# Patient Record
Sex: Male | Born: 1935 | Race: White | Hispanic: No
Health system: Southern US, Community
[De-identification: ages and names within clinical notes are randomized; demographics above are authoritative.]

---

## 1999-09-17 ENCOUNTER — Encounter: Payer: Self-pay | Admitting: *Deleted

## 1999-09-17 ENCOUNTER — Ambulatory Visit (HOSPITAL_COMMUNITY): Admission: RE | Admit: 1999-09-17 | Discharge: 1999-09-17 | Payer: Self-pay | Admitting: *Deleted

## 2005-10-17 ENCOUNTER — Ambulatory Visit: Payer: Self-pay | Admitting: Gastroenterology

## 2006-04-15 ENCOUNTER — Inpatient Hospital Stay: Payer: Self-pay | Admitting: General Practice

## 2008-04-01 ENCOUNTER — Ambulatory Visit: Payer: Self-pay | Admitting: Internal Medicine

## 2009-04-14 ENCOUNTER — Ambulatory Visit: Payer: Self-pay | Admitting: Internal Medicine

## 2011-02-12 ENCOUNTER — Ambulatory Visit: Payer: Self-pay | Admitting: Internal Medicine

## 2011-03-10 ENCOUNTER — Ambulatory Visit: Payer: Self-pay | Admitting: Internal Medicine

## 2011-10-17 ENCOUNTER — Ambulatory Visit: Payer: Self-pay | Admitting: Internal Medicine

## 2011-11-04 ENCOUNTER — Ambulatory Visit: Payer: Self-pay | Admitting: Internal Medicine

## 2012-02-08 ENCOUNTER — Ambulatory Visit: Payer: Self-pay | Admitting: Internal Medicine

## 2012-02-10 ENCOUNTER — Inpatient Hospital Stay: Payer: Self-pay | Admitting: Orthopedic Surgery

## 2012-02-10 LAB — URINALYSIS, COMPLETE
Bacteria: NONE SEEN
Bilirubin,UR: NEGATIVE
Leukocyte Esterase: NEGATIVE
Ph: 6 (ref 4.5–8.0)
Protein: NEGATIVE
RBC,UR: 1 /HPF (ref 0–5)
Specific Gravity: 1.015 (ref 1.003–1.030)
WBC UR: <1 /HPF (ref 0–5)

## 2012-02-10 LAB — CBC WITH DIFFERENTIAL/PLATELET
Basophil #: 0 10*3/uL (ref 0.0–0.1)
Basophil %: 0.6 %
Eosinophil #: 0.1 10*3/uL (ref 0.0–0.7)
Eosinophil %: 1.5 %
HCT: 35.8 % — ABNORMAL LOW (ref 40.0–52.0)
HGB: 11.7 g/dL — ABNORMAL LOW (ref 13.0–18.0)
Lymphocyte #: 0.7 10*3/uL — ABNORMAL LOW (ref 1.0–3.6)
Lymphocyte %: 10.5 %
MCH: 34.4 pg — ABNORMAL HIGH (ref 26.0–34.0)
MCHC: 32.8 g/dL (ref 32.0–36.0)
MCV: 105 fL — ABNORMAL HIGH (ref 80–100)
Monocyte #: 0.3 x10 3/mm (ref 0.2–1.0)
Monocyte %: 4.7 %
Neutrophil #: 5.8 10*3/uL (ref 1.4–6.5)
Neutrophil %: 82.7 %
Platelet: 150 10*3/uL (ref 150–440)
RBC: 3.42 10*6/uL — ABNORMAL LOW (ref 4.40–5.90)
RDW: 15 % — ABNORMAL HIGH (ref 11.5–14.5)
WBC: 7 10*3/uL (ref 3.8–10.6)

## 2012-02-10 LAB — BASIC METABOLIC PANEL
Anion Gap: 9 (ref 7–16)
BUN: 22 mg/dL — ABNORMAL HIGH (ref 7–18)
Calcium, Total: 8.9 mg/dL (ref 8.5–10.1)
Chloride: 108 mmol/L — ABNORMAL HIGH (ref 98–107)
Co2: 24 mmol/L (ref 21–32)
Creatinine: 1.04 mg/dL (ref 0.60–1.30)
EGFR (African American): >60
EGFR (Non-African Amer.): >60
Glucose: 138 mg/dL — ABNORMAL HIGH (ref 65–99)
Osmolality: 287 (ref 275–301)
Potassium: 4.2 mmol/L (ref 3.5–5.1)
Sodium: 141 mmol/L (ref 136–145)

## 2012-02-10 LAB — PROTIME-INR
INR: 1.1
Prothrombin Time: 14.7 secs (ref 11.5–14.7)

## 2012-02-10 LAB — APTT: Activated PTT: 43 secs — ABNORMAL HIGH (ref 23.6–35.9)

## 2012-02-11 LAB — CBC WITH DIFFERENTIAL/PLATELET
Basophil #: 0 10*3/uL (ref 0.0–0.1)
Basophil %: 0.3 %
Eosinophil #: 0.2 10*3/uL (ref 0.0–0.7)
Eosinophil %: 2.2 %
HCT: 30.7 % — ABNORMAL LOW (ref 40.0–52.0)
Lymphocyte %: 6.8 %
MCH: 35 pg — ABNORMAL HIGH (ref 26.0–34.0)
MCHC: 33.6 g/dL (ref 32.0–36.0)
MCV: 104 fL — ABNORMAL HIGH (ref 80–100)
Monocyte #: 0.3 x10 3/mm (ref 0.2–1.0)
Monocyte %: 3.5 %
Neutrophil #: 8.2 10*3/uL — ABNORMAL HIGH (ref 1.4–6.5)
Neutrophil %: 87.2 %
Platelet: 122 10*3/uL — ABNORMAL LOW (ref 150–440)
RBC: 2.94 10*6/uL — ABNORMAL LOW (ref 4.40–5.90)

## 2012-02-11 LAB — PROTIME-INR
INR: 1.4
Prothrombin Time: 17.7 secs — ABNORMAL HIGH (ref 11.5–14.7)

## 2012-02-11 LAB — HEPATIC FUNCTION PANEL A (ARMC)
Albumin: 3 g/dL — ABNORMAL LOW (ref 3.4–5.0)
Alkaline Phosphatase: 94 U/L (ref 50–136)
Bilirubin, Direct: 0.3 mg/dL — ABNORMAL HIGH (ref 0.00–0.20)
Bilirubin,Total: 1.3 mg/dL — ABNORMAL HIGH (ref 0.2–1.0)
SGOT(AST): 13 U/L — ABNORMAL LOW (ref 15–37)
SGPT (ALT): 18 U/L
Total Protein: 6.2 g/dL — ABNORMAL LOW (ref 6.4–8.2)

## 2012-02-11 LAB — BASIC METABOLIC PANEL
Calcium, Total: 8 mg/dL — ABNORMAL LOW (ref 8.5–10.1)
Chloride: 108 mmol/L — ABNORMAL HIGH (ref 98–107)
Co2: 25 mmol/L (ref 21–32)
Creatinine: 1.17 mg/dL (ref 0.60–1.30)
EGFR (African American): >60
EGFR (Non-African Amer.): 53 — ABNORMAL LOW
Glucose: 176 mg/dL — ABNORMAL HIGH (ref 65–99)
Potassium: 4.1 mmol/L (ref 3.5–5.1)
Sodium: 139 mmol/L (ref 136–145)

## 2012-02-12 LAB — BASIC METABOLIC PANEL
Anion Gap: 6 — ABNORMAL LOW (ref 7–16)
BUN: 19 mg/dL — ABNORMAL HIGH (ref 7–18)
Calcium, Total: 8.3 mg/dL — ABNORMAL LOW (ref 8.5–10.1)
Chloride: 108 mmol/L — ABNORMAL HIGH (ref 98–107)
Co2: 27 mmol/L (ref 21–32)
Creatinine: 1.19 mg/dL (ref 0.60–1.30)
Glucose: 149 mg/dL — ABNORMAL HIGH (ref 65–99)
Sodium: 141 mmol/L (ref 136–145)

## 2012-02-12 LAB — CBC WITH DIFFERENTIAL/PLATELET
Basophil #: 0 10*3/uL (ref 0.0–0.1)
Eosinophil #: 0.3 10*3/uL (ref 0.0–0.7)
HCT: 28.5 % — ABNORMAL LOW (ref 40.0–52.0)
HGB: 9.6 g/dL — ABNORMAL LOW (ref 13.0–18.0)
MCHC: 33.7 g/dL (ref 32.0–36.0)
MCV: 104 fL — ABNORMAL HIGH (ref 80–100)
Monocyte #: 0.3 x10 3/mm (ref 0.2–1.0)
Monocyte %: 3.4 %
RBC: 2.75 10*6/uL — ABNORMAL LOW (ref 4.40–5.90)
RDW: 15.1 % — ABNORMAL HIGH (ref 11.5–14.5)
WBC: 7.4 10*3/uL (ref 3.8–10.6)

## 2012-02-12 LAB — PROTIME-INR
INR: 1.2
Prothrombin Time: 15.9 secs — ABNORMAL HIGH (ref 11.5–14.7)

## 2012-02-12 LAB — APTT: Activated PTT: 47.5 secs — ABNORMAL HIGH (ref 23.6–35.9)

## 2012-02-13 LAB — CBC WITH DIFFERENTIAL/PLATELET
Basophil #: 0 10*3/uL (ref 0.0–0.1)
Basophil %: 0.5 %
Eosinophil %: 0.7 %
HCT: 32.7 % — ABNORMAL LOW (ref 40.0–52.0)
Lymphocyte #: 0.4 10*3/uL — ABNORMAL LOW (ref 1.0–3.6)
Lymphocyte %: 4.4 %
MCH: 33.5 pg (ref 26.0–34.0)
MCV: 101 fL — ABNORMAL HIGH (ref 80–100)
Platelet: 120 10*3/uL — ABNORMAL LOW (ref 150–440)
RBC: 3.23 10*6/uL — ABNORMAL LOW (ref 4.40–5.90)
RDW: 18.3 % — ABNORMAL HIGH (ref 11.5–14.5)
WBC: 8.7 10*3/uL (ref 3.8–10.6)

## 2012-02-13 LAB — BASIC METABOLIC PANEL
Anion Gap: 10 (ref 7–16)
Chloride: 105 mmol/L (ref 98–107)
Co2: 23 mmol/L (ref 21–32)
Creatinine: 1.13 mg/dL (ref 0.60–1.30)
EGFR (African American): >60
EGFR (Non-African Amer.): 56 — ABNORMAL LOW
Sodium: 138 mmol/L (ref 136–145)

## 2012-02-13 LAB — PROTIME-INR
INR: 1.2
Prothrombin Time: 16 secs — ABNORMAL HIGH (ref 11.5–14.7)
Prothrombin Time: 17 secs — ABNORMAL HIGH (ref 11.5–14.7)

## 2012-02-13 LAB — APTT: Activated PTT: 41.1 secs — ABNORMAL HIGH (ref 23.6–35.9)

## 2012-02-14 LAB — PROTIME-INR
INR: 1.4
Prothrombin Time: 17.2 secs — ABNORMAL HIGH (ref 11.5–14.7)

## 2012-02-14 LAB — APTT: Activated PTT: 43.7 secs — ABNORMAL HIGH (ref 23.6–35.9)

## 2012-02-14 LAB — CBC WITH DIFFERENTIAL/PLATELET
Basophil #: 0 10*3/uL (ref 0.0–0.1)
Basophil %: 0 %
Eosinophil #: 0 10*3/uL (ref 0.0–0.7)
HCT: 26.5 % — ABNORMAL LOW (ref 40.0–52.0)
MCH: 34.1 pg — ABNORMAL HIGH (ref 26.0–34.0)
Monocyte #: 0.4 x10 3/mm (ref 0.2–1.0)
Neutrophil %: 90.5 %
Platelet: 114 10*3/uL — ABNORMAL LOW (ref 150–440)
RBC: 2.65 10*6/uL — ABNORMAL LOW (ref 4.40–5.90)
RDW: 18.3 % — ABNORMAL HIGH (ref 11.5–14.5)

## 2012-02-14 LAB — BASIC METABOLIC PANEL
Anion Gap: 7 (ref 7–16)
BUN: 26 mg/dL — ABNORMAL HIGH (ref 7–18)
Chloride: 106 mmol/L (ref 98–107)
Co2: 26 mmol/L (ref 21–32)
Osmolality: 289 (ref 275–301)
Sodium: 139 mmol/L (ref 136–145)

## 2012-02-15 LAB — CBC WITH DIFFERENTIAL/PLATELET
Eosinophil #: 0.3 10*3/uL (ref 0.0–0.7)
HCT: 24.4 % — ABNORMAL LOW (ref 40.0–52.0)
Lymphocyte %: 10.2 %
MCV: 101 fL — ABNORMAL HIGH (ref 80–100)
Monocyte #: 0.3 x10 3/mm (ref 0.2–1.0)
Monocyte %: 4 %
Neutrophil #: 6.1 10*3/uL (ref 1.4–6.5)
Neutrophil %: 81.2 %
RBC: 2.42 10*6/uL — ABNORMAL LOW (ref 4.40–5.90)
RDW: 17.6 % — ABNORMAL HIGH (ref 11.5–14.5)
WBC: 7.5 10*3/uL (ref 3.8–10.6)

## 2012-02-15 LAB — APTT: Activated PTT: 46.1 secs — ABNORMAL HIGH (ref 23.6–35.9)

## 2012-02-15 LAB — PROTIME-INR: Prothrombin Time: 16 secs — ABNORMAL HIGH (ref 11.5–14.7)

## 2012-02-16 LAB — CBC WITH DIFFERENTIAL/PLATELET
Comment - H1-Com3: NORMAL
Eosinophil: 10 %
HCT: 24.3 % — ABNORMAL LOW (ref 40.0–52.0)
HGB: 8.1 g/dL — ABNORMAL LOW (ref 13.0–18.0)
MCV: 100 fL (ref 80–100)
Platelet: 158 10*3/uL (ref 150–440)
Segmented Neutrophils: 72 %
WBC: 7 10*3/uL (ref 3.8–10.6)

## 2012-02-16 LAB — PROTIME-INR: INR: 1.2

## 2012-02-17 LAB — BASIC METABOLIC PANEL
Anion Gap: 10 (ref 7–16)
Calcium, Total: 8.6 mg/dL (ref 8.5–10.1)
Chloride: 103 mmol/L (ref 98–107)
Co2: 27 mmol/L (ref 21–32)
EGFR (African American): >60
EGFR (Non-African Amer.): 52 — ABNORMAL LOW
Glucose: 133 mg/dL — ABNORMAL HIGH (ref 65–99)
Osmolality: 285 (ref 275–301)
Potassium: 3.9 mmol/L (ref 3.5–5.1)

## 2012-02-17 LAB — HEMOGLOBIN: HGB: 8.6 g/dL — ABNORMAL LOW (ref 13.0–18.0)

## 2012-02-17 LAB — PATHOLOGY REPORT

## 2012-02-19 LAB — CBC WITH DIFFERENTIAL/PLATELET
Basophil %: 0.5 %
Eosinophil #: 0.2 10*3/uL (ref 0.0–0.7)
Eosinophil %: 2.3 %
HCT: 24.8 % — ABNORMAL LOW (ref 40.0–52.0)
HGB: 8.1 g/dL — ABNORMAL LOW (ref 13.0–18.0)
Lymphocyte #: 0.9 10*3/uL — ABNORMAL LOW (ref 1.0–3.6)
Lymphocyte %: 7.8 %
MCH: 33.3 pg (ref 26.0–34.0)
MCV: 102 fL — ABNORMAL HIGH (ref 80–100)
Monocyte %: 4.6 %
Platelet: 310 10*3/uL (ref 150–440)
WBC: 10.9 10*3/uL — ABNORMAL HIGH (ref 3.8–10.6)

## 2012-02-19 LAB — PROTIME-INR
INR: 1.2
Prothrombin Time: 15.5 secs — ABNORMAL HIGH (ref 11.5–14.7)

## 2012-02-19 LAB — BASIC METABOLIC PANEL
Anion Gap: 9 (ref 7–16)
Calcium, Total: 8.4 mg/dL — ABNORMAL LOW (ref 8.5–10.1)
Chloride: 105 mmol/L (ref 98–107)
Co2: 28 mmol/L (ref 21–32)
EGFR (African American): >60
Potassium: 4.4 mmol/L (ref 3.5–5.1)
Sodium: 142 mmol/L (ref 136–145)

## 2012-02-19 LAB — APTT: Activated PTT: 48 secs — ABNORMAL HIGH (ref 23.6–35.9)

## 2012-02-20 LAB — CBC WITH DIFFERENTIAL/PLATELET
Basophil %: 0.4 %
Eosinophil #: 0.2 10*3/uL (ref 0.0–0.7)
HCT: 24 % — ABNORMAL LOW (ref 40.0–52.0)
MCH: 33.6 pg (ref 26.0–34.0)
MCHC: 33 g/dL (ref 32.0–36.0)
MCV: 102 fL — ABNORMAL HIGH (ref 80–100)
Monocyte #: 0.6 x10 3/mm (ref 0.2–1.0)
Monocyte %: 5.5 %
Neutrophil #: 9 10*3/uL — ABNORMAL HIGH (ref 1.4–6.5)
Platelet: 333 10*3/uL (ref 150–440)
RDW: 16.9 % — ABNORMAL HIGH (ref 11.5–14.5)
WBC: 10.6 10*3/uL (ref 3.8–10.6)

## 2012-02-21 LAB — CBC WITH DIFFERENTIAL/PLATELET
Basophil #: 0 10*3/uL (ref 0.0–0.1)
Eosinophil #: 0.1 10*3/uL (ref 0.0–0.7)
Lymphocyte %: 4.5 %
MCH: 32.8 pg (ref 26.0–34.0)
MCHC: 31.7 g/dL — ABNORMAL LOW (ref 32.0–36.0)
MCV: 104 fL — ABNORMAL HIGH (ref 80–100)
Monocyte #: 1 x10 3/mm (ref 0.2–1.0)
Monocyte %: 6.9 %
WBC: 14.4 10*3/uL — ABNORMAL HIGH (ref 3.8–10.6)

## 2012-02-23 ENCOUNTER — Encounter: Payer: Self-pay | Admitting: Internal Medicine

## 2012-02-27 ENCOUNTER — Emergency Department: Payer: Self-pay | Admitting: Emergency Medicine

## 2012-03-09 ENCOUNTER — Encounter: Payer: Self-pay | Admitting: Internal Medicine

## 2012-03-09 ENCOUNTER — Ambulatory Visit: Payer: Self-pay | Admitting: Oncology

## 2012-03-25 ENCOUNTER — Emergency Department: Payer: Self-pay | Admitting: Emergency Medicine

## 2012-03-26 ENCOUNTER — Inpatient Hospital Stay: Payer: Self-pay | Admitting: Internal Medicine

## 2012-03-26 LAB — URINALYSIS, COMPLETE
Bilirubin,UR: NEGATIVE
Blood: NEGATIVE
Glucose,UR: NEGATIVE mg/dL (ref 0–75)
Ketone: NEGATIVE
Leukocyte Esterase: NEGATIVE
Nitrite: NEGATIVE
Ph: 5 (ref 4.5–8.0)
Protein: NEGATIVE
RBC,UR: 2 /HPF (ref 0–5)
Specific Gravity: 1.021 (ref 1.003–1.030)
Squamous Epithelial: <1
WBC UR: 1 /HPF (ref 0–5)

## 2012-03-26 LAB — CBC
HCT: 29.1 % — ABNORMAL LOW (ref 40.0–52.0)
HGB: 9.6 g/dL — ABNORMAL LOW (ref 13.0–18.0)
MCH: 34 pg (ref 26.0–34.0)
MCHC: 33 g/dL (ref 32.0–36.0)
MCV: 103 fL — ABNORMAL HIGH (ref 80–100)
Platelet: 121 10*3/uL — ABNORMAL LOW (ref 150–440)
RBC: 2.82 10*6/uL — ABNORMAL LOW (ref 4.40–5.90)
RDW: 17.3 % — ABNORMAL HIGH (ref 11.5–14.5)
WBC: 9.4 10*3/uL (ref 3.8–10.6)

## 2012-03-26 LAB — COMPREHENSIVE METABOLIC PANEL
Albumin: 2.5 g/dL — ABNORMAL LOW (ref 3.4–5.0)
Alkaline Phosphatase: 192 U/L — ABNORMAL HIGH (ref 50–136)
Anion Gap: 11 (ref 7–16)
BUN: 32 mg/dL — ABNORMAL HIGH (ref 7–18)
Bilirubin,Total: 1.2 mg/dL — ABNORMAL HIGH (ref 0.2–1.0)
Calcium, Total: 8.1 mg/dL — ABNORMAL LOW (ref 8.5–10.1)
Chloride: 102 mmol/L (ref 98–107)
Co2: 22 mmol/L (ref 21–32)
Creatinine: 1.69 mg/dL — ABNORMAL HIGH (ref 0.60–1.30)
EGFR (African American): 40 — ABNORMAL LOW
EGFR (Non-African Amer.): 34 — ABNORMAL LOW
Glucose: 171 mg/dL — ABNORMAL HIGH (ref 65–99)
Osmolality: 281 (ref 275–301)
Potassium: 3.9 mmol/L (ref 3.5–5.1)
SGOT(AST): 31 U/L (ref 15–37)
SGPT (ALT): 105 U/L — ABNORMAL HIGH
Sodium: 135 mmol/L — ABNORMAL LOW (ref 136–145)
Total Protein: 5.7 g/dL — ABNORMAL LOW (ref 6.4–8.2)

## 2012-03-26 LAB — CK TOTAL AND CKMB (NOT AT ARMC)
CK, Total: 141 U/L (ref 35–232)
CK-MB: 1.1 ng/mL (ref 0.5–3.6)

## 2012-03-26 LAB — DIGOXIN LEVEL: Digoxin: 1.08 ng/mL

## 2012-03-26 LAB — TROPONIN I
Troponin-I: 0.17 ng/mL — ABNORMAL HIGH
Troponin-I: 0.19 ng/mL — ABNORMAL HIGH

## 2012-03-26 LAB — PRO B NATRIURETIC PEPTIDE: B-Type Natriuretic Peptide: 16080 pg/mL — ABNORMAL HIGH (ref 0–450)

## 2012-03-27 LAB — CBC WITH DIFFERENTIAL/PLATELET
Basophil %: 0.2 %
Eosinophil #: 0.1 10*3/uL (ref 0.0–0.7)
Eosinophil %: 1 %
HCT: 27.2 % — ABNORMAL LOW (ref 40.0–52.0)
HGB: 8.7 g/dL — ABNORMAL LOW (ref 13.0–18.0)
Lymphocyte #: 0.4 10*3/uL — ABNORMAL LOW (ref 1.0–3.6)
MCH: 33.3 pg (ref 26.0–34.0)
MCHC: 32.1 g/dL (ref 32.0–36.0)
MCV: 104 fL — ABNORMAL HIGH (ref 80–100)
Monocyte #: 0.3 x10 3/mm (ref 0.2–1.0)
Monocyte %: 3.1 %
Neutrophil #: 7.2 10*3/uL — ABNORMAL HIGH (ref 1.4–6.5)
RBC: 2.62 10*6/uL — ABNORMAL LOW (ref 4.40–5.90)

## 2012-03-27 LAB — COMPREHENSIVE METABOLIC PANEL
Albumin: 2.1 g/dL — ABNORMAL LOW (ref 3.4–5.0)
Alkaline Phosphatase: 168 U/L — ABNORMAL HIGH (ref 50–136)
Anion Gap: 9 (ref 7–16)
BUN: 34 mg/dL — ABNORMAL HIGH (ref 7–18)
Calcium, Total: 7.5 mg/dL — ABNORMAL LOW (ref 8.5–10.1)
Co2: 22 mmol/L (ref 21–32)
Glucose: 181 mg/dL — ABNORMAL HIGH (ref 65–99)
Potassium: 4.2 mmol/L (ref 3.5–5.1)
SGOT(AST): 27 U/L (ref 15–37)
SGPT (ALT): 82 U/L — ABNORMAL HIGH
Sodium: 136 mmol/L (ref 136–145)

## 2012-03-27 LAB — BASIC METABOLIC PANEL
Anion Gap: 11 (ref 7–16)
BUN: 38 mg/dL — ABNORMAL HIGH (ref 7–18)
Co2: 20 mmol/L — ABNORMAL LOW (ref 21–32)
Creatinine: 2.02 mg/dL — ABNORMAL HIGH (ref 0.60–1.30)
EGFR (African American): 32 — ABNORMAL LOW
EGFR (Non-African Amer.): 28 — ABNORMAL LOW
Sodium: 136 mmol/L (ref 136–145)

## 2012-03-27 LAB — MAGNESIUM: Magnesium: 1.5 mg/dL — ABNORMAL LOW

## 2012-03-28 LAB — COMPREHENSIVE METABOLIC PANEL
Albumin: 2 g/dL — ABNORMAL LOW (ref 3.4–5.0)
Anion Gap: 12 (ref 7–16)
BUN: 35 mg/dL — ABNORMAL HIGH (ref 7–18)
Bilirubin,Total: 0.7 mg/dL (ref 0.2–1.0)
Chloride: 104 mmol/L (ref 98–107)
Co2: 18 mmol/L — ABNORMAL LOW (ref 21–32)
EGFR (African American): 43 — ABNORMAL LOW
SGOT(AST): 14 U/L — ABNORMAL LOW (ref 15–37)
SGPT (ALT): 55 U/L

## 2012-03-28 LAB — CBC WITH DIFFERENTIAL/PLATELET
Basophil %: 0.2 %
HCT: 26 % — ABNORMAL LOW (ref 40.0–52.0)
HGB: 8.8 g/dL — ABNORMAL LOW (ref 13.0–18.0)
Lymphocyte #: 0.4 10*3/uL — ABNORMAL LOW (ref 1.0–3.6)
Lymphocyte %: 4.7 %
MCH: 34.3 pg — ABNORMAL HIGH (ref 26.0–34.0)
MCV: 102 fL — ABNORMAL HIGH (ref 80–100)
Monocyte %: 2.5 %
Neutrophil #: 6.7 10*3/uL — ABNORMAL HIGH (ref 1.4–6.5)
Platelet: 123 10*3/uL — ABNORMAL LOW (ref 150–440)
RDW: 17.6 % — ABNORMAL HIGH (ref 11.5–14.5)
WBC: 7.4 10*3/uL (ref 3.8–10.6)

## 2012-03-29 LAB — CBC WITH DIFFERENTIAL/PLATELET
Basophil %: 0.5 %
Eosinophil #: 0.3 10*3/uL (ref 0.0–0.7)
HCT: 27.5 % — ABNORMAL LOW (ref 40.0–52.0)
HGB: 8.8 g/dL — ABNORMAL LOW (ref 13.0–18.0)
Lymphocyte #: 0.5 10*3/uL — ABNORMAL LOW (ref 1.0–3.6)
MCH: 33.3 pg (ref 26.0–34.0)
MCHC: 32.2 g/dL (ref 32.0–36.0)
MCV: 103 fL — ABNORMAL HIGH (ref 80–100)
Monocyte #: 0.2 x10 3/mm (ref 0.2–1.0)
Monocyte %: 3.3 %
Neutrophil #: 5.9 10*3/uL (ref 1.4–6.5)
Neutrophil %: 84.1 %
Platelet: 133 10*3/uL — ABNORMAL LOW (ref 150–440)
WBC: 7 10*3/uL (ref 3.8–10.6)

## 2012-03-29 LAB — BASIC METABOLIC PANEL
Anion Gap: 10 (ref 7–16)
BUN: 36 mg/dL — ABNORMAL HIGH (ref 7–18)
Calcium, Total: 7.9 mg/dL — ABNORMAL LOW (ref 8.5–10.1)
Chloride: 106 mmol/L (ref 98–107)
Co2: 20 mmol/L — ABNORMAL LOW (ref 21–32)
EGFR (African American): 40 — ABNORMAL LOW
Glucose: 128 mg/dL — ABNORMAL HIGH (ref 65–99)
Osmolality: 282 (ref 275–301)
Potassium: 4 mmol/L (ref 3.5–5.1)

## 2012-03-29 LAB — CULTURE, BLOOD (SINGLE)

## 2012-03-30 LAB — BASIC METABOLIC PANEL
Calcium, Total: 8 mg/dL — ABNORMAL LOW (ref 8.5–10.1)
Creatinine: 1.48 mg/dL — ABNORMAL HIGH (ref 0.60–1.30)
EGFR (African American): 47 — ABNORMAL LOW
EGFR (Non-African Amer.): 40 — ABNORMAL LOW
Glucose: 107 mg/dL — ABNORMAL HIGH (ref 65–99)
Osmolality: 282 (ref 275–301)
Potassium: 4.3 mmol/L (ref 3.5–5.1)

## 2012-03-30 LAB — CBC WITH DIFFERENTIAL/PLATELET
Basophil #: 0 10*3/uL (ref 0.0–0.1)
Basophil %: 0.3 %
Eosinophil %: 4.9 %
HGB: 8.7 g/dL — ABNORMAL LOW (ref 13.0–18.0)
Lymphocyte %: 5.6 %
MCH: 33.8 pg (ref 26.0–34.0)
MCHC: 32.9 g/dL (ref 32.0–36.0)
Monocyte #: 0.4 x10 3/mm (ref 0.2–1.0)
Monocyte %: 4.6 %
Neutrophil #: 6.9 10*3/uL — ABNORMAL HIGH (ref 1.4–6.5)
Neutrophil %: 84.6 %
RBC: 2.58 10*6/uL — ABNORMAL LOW (ref 4.40–5.90)
WBC: 8.1 10*3/uL (ref 3.8–10.6)

## 2012-03-30 LAB — VANCOMYCIN, TROUGH: Vancomycin, Trough: 7 ug/mL — ABNORMAL LOW (ref 10–20)

## 2012-03-31 LAB — BASIC METABOLIC PANEL
Anion Gap: 9 (ref 7–16)
BUN: 28 mg/dL — ABNORMAL HIGH (ref 7–18)
Calcium, Total: 8.4 mg/dL — ABNORMAL LOW (ref 8.5–10.1)
Creatinine: 1.28 mg/dL (ref 0.60–1.30)
EGFR (African American): 56 — ABNORMAL LOW
EGFR (Non-African Amer.): 48 — ABNORMAL LOW
Glucose: 142 mg/dL — ABNORMAL HIGH (ref 65–99)
Osmolality: 284 (ref 275–301)
Sodium: 138 mmol/L (ref 136–145)

## 2012-04-01 LAB — CBC WITH DIFFERENTIAL/PLATELET
Basophil %: 0.4 %
Eosinophil %: 3.6 %
HGB: 9.7 g/dL — ABNORMAL LOW (ref 13.0–18.0)
Lymphocyte %: 9 %
MCH: 33.7 pg (ref 26.0–34.0)
MCV: 101 fL — ABNORMAL HIGH (ref 80–100)
Monocyte #: 0.6 x10 3/mm (ref 0.2–1.0)
Monocyte %: 6.3 %
Neutrophil #: 7.3 10*3/uL — ABNORMAL HIGH (ref 1.4–6.5)
Neutrophil %: 80.7 %
Platelet: 248 10*3/uL (ref 150–440)
RDW: 17.9 % — ABNORMAL HIGH (ref 11.5–14.5)
WBC: 9.1 10*3/uL (ref 3.8–10.6)

## 2012-04-01 LAB — CULTURE, BLOOD (SINGLE)

## 2012-04-01 LAB — BASIC METABOLIC PANEL
Anion Gap: 8 (ref 7–16)
BUN: 24 mg/dL — ABNORMAL HIGH (ref 7–18)
Creatinine: 1.21 mg/dL (ref 0.60–1.30)
EGFR (African American): 59 — ABNORMAL LOW
EGFR (Non-African Amer.): 51 — ABNORMAL LOW
Glucose: 165 mg/dL — ABNORMAL HIGH (ref 65–99)
Osmolality: 285 (ref 275–301)
Sodium: 139 mmol/L (ref 136–145)

## 2012-04-03 LAB — CBC WITH DIFFERENTIAL/PLATELET
Bands: 8 %
HCT: 30.6 % — ABNORMAL LOW (ref 40.0–52.0)
HGB: 10.5 g/dL — ABNORMAL LOW (ref 13.0–18.0)
Lymphocytes: 7 %
MCH: 34.4 pg — ABNORMAL HIGH (ref 26.0–34.0)
MCHC: 34.3 g/dL (ref 32.0–36.0)
MCV: 100 fL (ref 80–100)
Metamyelocyte: 4 %
Monocytes: 3 %
Myelocyte: 3 %
Platelet: 286 10*3/uL (ref 150–440)
RBC: 3.05 10*6/uL — ABNORMAL LOW (ref 4.40–5.90)
RDW: 18.1 % — ABNORMAL HIGH (ref 11.5–14.5)
Segmented Neutrophils: 74 %
Variant Lymphocyte - H1-Rlymph: 1 %
WBC: 11.8 10*3/uL — ABNORMAL HIGH (ref 3.8–10.6)

## 2012-04-03 LAB — SEDIMENTATION RATE: Erythrocyte Sed Rate: 35 mm/hr — ABNORMAL HIGH (ref 0–20)

## 2012-04-04 LAB — BASIC METABOLIC PANEL
Anion Gap: 6 — ABNORMAL LOW (ref 7–16)
BUN: 22 mg/dL — ABNORMAL HIGH (ref 7–18)
Calcium, Total: 8.4 mg/dL — ABNORMAL LOW (ref 8.5–10.1)
Co2: 32 mmol/L (ref 21–32)
Creatinine: 1.1 mg/dL (ref 0.60–1.30)
EGFR (African American): >60
EGFR (Non-African Amer.): 58 — ABNORMAL LOW

## 2012-04-06 LAB — BASIC METABOLIC PANEL
Anion Gap: 7 (ref 7–16)
BUN: 23 mg/dL — ABNORMAL HIGH (ref 7–18)
Calcium, Total: 8.3 mg/dL — ABNORMAL LOW (ref 8.5–10.1)
Co2: 30 mmol/L (ref 21–32)
Creatinine: 1.09 mg/dL (ref 0.60–1.30)
EGFR (African American): >60
EGFR (Non-African Amer.): 58 — ABNORMAL LOW
Osmolality: 284 (ref 275–301)
Potassium: 4.2 mmol/L (ref 3.5–5.1)

## 2012-04-06 LAB — CBC WITH DIFFERENTIAL/PLATELET
Basophil #: 0 10*3/uL (ref 0.0–0.1)
Eosinophil #: 0 10*3/uL (ref 0.0–0.7)
Eosinophil %: 0.3 %
HCT: 26.5 % — ABNORMAL LOW (ref 40.0–52.0)
HGB: 9.1 g/dL — ABNORMAL LOW (ref 13.0–18.0)
Lymphocyte #: 0.7 10*3/uL — ABNORMAL LOW (ref 1.0–3.6)
MCH: 34.4 pg — ABNORMAL HIGH (ref 26.0–34.0)
MCHC: 34.3 g/dL (ref 32.0–36.0)
MCV: 100 fL (ref 80–100)
Monocyte #: 1 x10 3/mm (ref 0.2–1.0)
Neutrophil %: 87.5 %
Platelet: 239 10*3/uL (ref 150–440)
RBC: 2.64 10*6/uL — ABNORMAL LOW (ref 4.40–5.90)
RDW: 17.8 % — ABNORMAL HIGH (ref 11.5–14.5)
WBC: 14.4 10*3/uL — ABNORMAL HIGH (ref 3.8–10.6)

## 2012-04-07 LAB — URINALYSIS, COMPLETE
Bilirubin,UR: NEGATIVE
Nitrite: NEGATIVE
Ph: 7 (ref 4.5–8.0)
Protein: NEGATIVE
Specific Gravity: 1.021 (ref 1.003–1.030)
WBC UR: 1 /HPF (ref 0–5)

## 2012-04-07 LAB — CBC WITH DIFFERENTIAL/PLATELET
Basophil #: 0 10*3/uL (ref 0.0–0.1)
Eosinophil %: 1.5 %
HGB: 9.2 g/dL — ABNORMAL LOW (ref 13.0–18.0)
Lymphocyte #: 0.8 10*3/uL — ABNORMAL LOW (ref 1.0–3.6)
Lymphocyte %: 6.7 %
MCHC: 32.8 g/dL (ref 32.0–36.0)
MCV: 100 fL (ref 80–100)
Monocyte %: 6 %
Neutrophil %: 85.5 %
Platelet: 274 10*3/uL (ref 150–440)
RDW: 17.9 % — ABNORMAL HIGH (ref 11.5–14.5)

## 2012-04-08 ENCOUNTER — Encounter: Payer: Self-pay | Admitting: Internal Medicine

## 2012-04-09 ENCOUNTER — Emergency Department: Payer: Self-pay | Admitting: Emergency Medicine

## 2012-04-09 LAB — PROTIME-INR
INR: 1.2
Prothrombin Time: 16 secs — ABNORMAL HIGH (ref 11.5–14.7)

## 2012-04-09 LAB — COMPREHENSIVE METABOLIC PANEL
Albumin: 1.9 g/dL — ABNORMAL LOW (ref 3.4–5.0)
Anion Gap: 9 (ref 7–16)
BUN: 24 mg/dL — ABNORMAL HIGH (ref 7–18)
Calcium, Total: 8.6 mg/dL (ref 8.5–10.1)
Chloride: 101 mmol/L (ref 98–107)
Co2: 27 mmol/L (ref 21–32)
EGFR (African American): >60
EGFR (Non-African Amer.): >60
Potassium: 4 mmol/L (ref 3.5–5.1)
SGOT(AST): 27 U/L (ref 15–37)
SGPT (ALT): 23 U/L
Total Protein: 6.4 g/dL (ref 6.4–8.2)

## 2012-04-09 LAB — CBC
HCT: 29.3 % — ABNORMAL LOW (ref 40.0–52.0)
HGB: 9.8 g/dL — ABNORMAL LOW (ref 13.0–18.0)
MCHC: 33.4 g/dL (ref 32.0–36.0)
MCV: 101 fL — ABNORMAL HIGH (ref 80–100)
Platelet: 294 10*3/uL (ref 150–440)
RBC: 2.9 10*6/uL — ABNORMAL LOW (ref 4.40–5.90)
RDW: 17.9 % — ABNORMAL HIGH (ref 11.5–14.5)
WBC: 9.1 10*3/uL (ref 3.8–10.6)

## 2012-04-09 LAB — DIGOXIN LEVEL: Digoxin: 0.64 ng/mL

## 2012-04-09 LAB — CULTURE, BLOOD (SINGLE)

## 2012-04-09 LAB — APTT: Activated PTT: 49.3 secs — ABNORMAL HIGH (ref 23.6–35.9)

## 2012-04-10 ENCOUNTER — Encounter: Payer: Self-pay | Admitting: Internal Medicine

## 2012-05-10 ENCOUNTER — Encounter: Payer: Self-pay | Admitting: Internal Medicine

## 2012-05-10 DEATH — deceased

## 2013-07-17 IMAGING — CR DG CHEST 2V
1 series · 3 of 3 positions shown · non-contrast
Comparison: none

REASON FOR EXAM: cough
COMMENTS:

[Series 1: pa · 0.17mm/px · 3 of 3 slices shown]
[im 1/3]
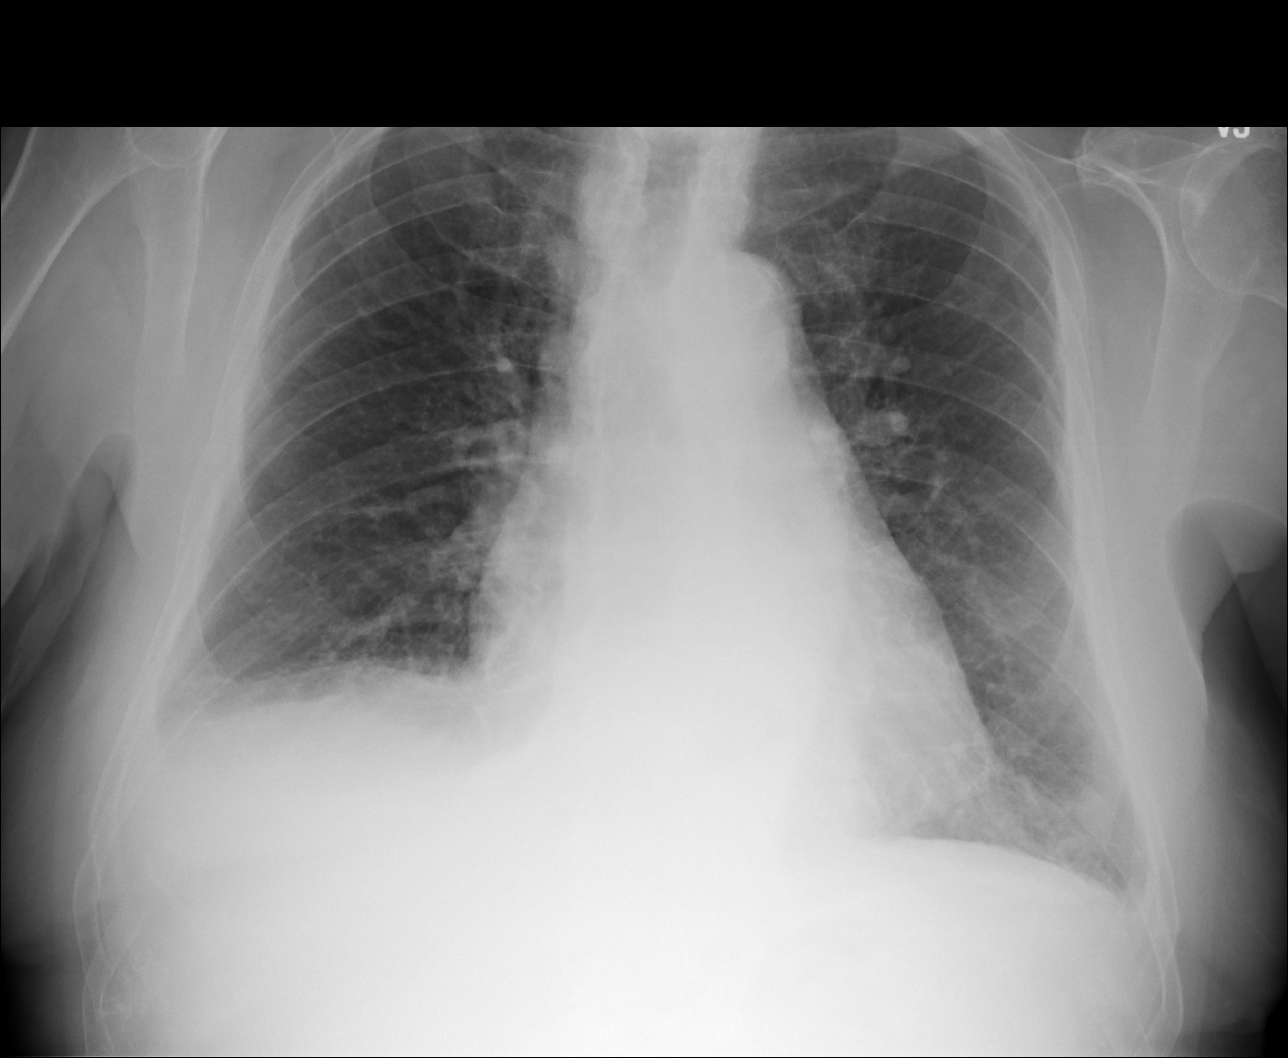
[im 2/3]
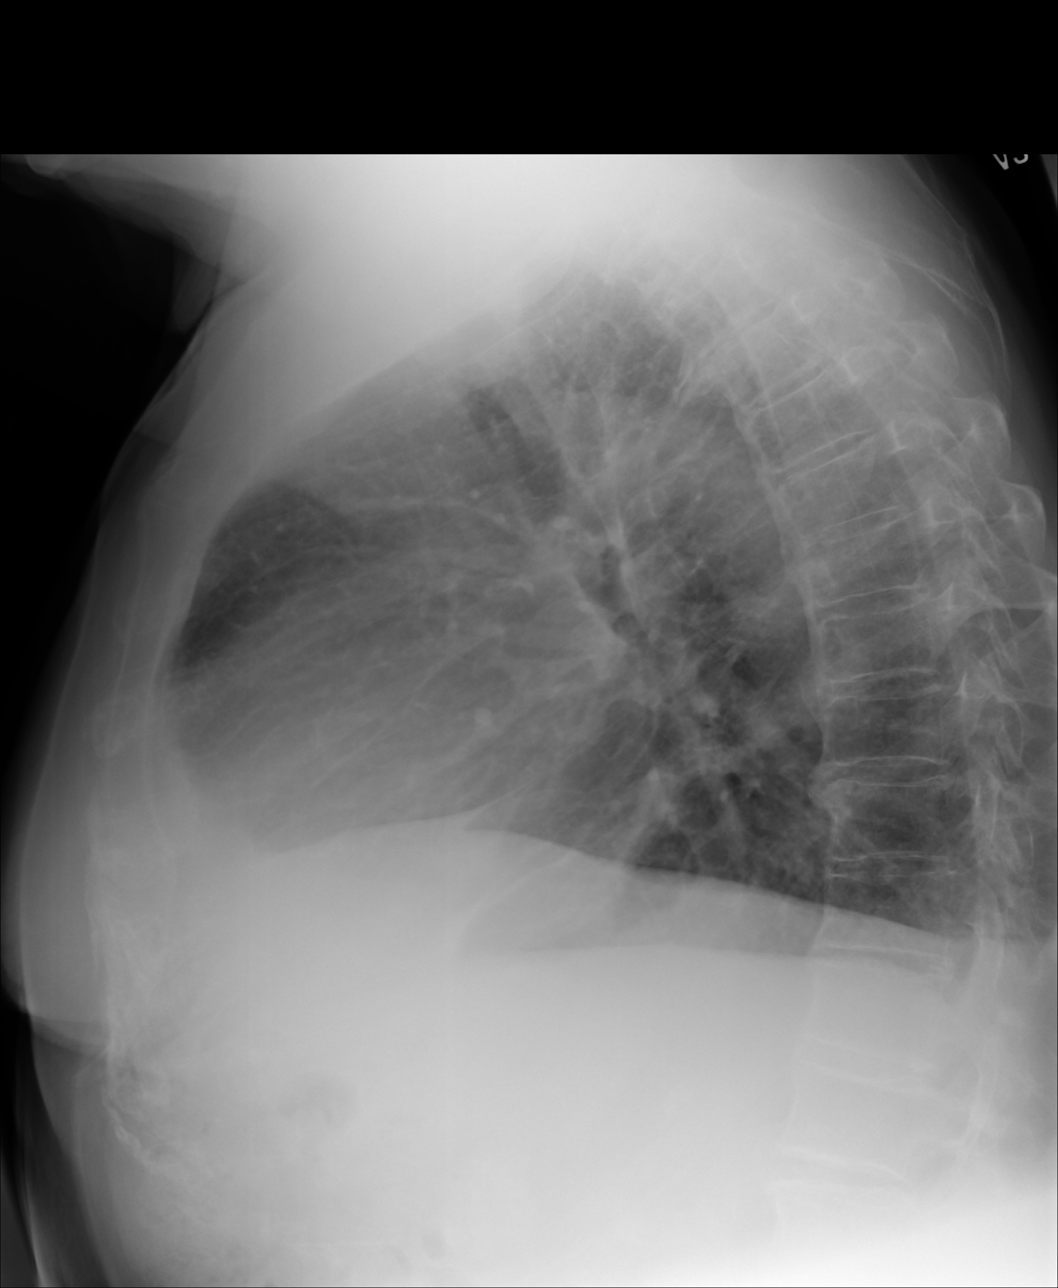
[im 3/3]
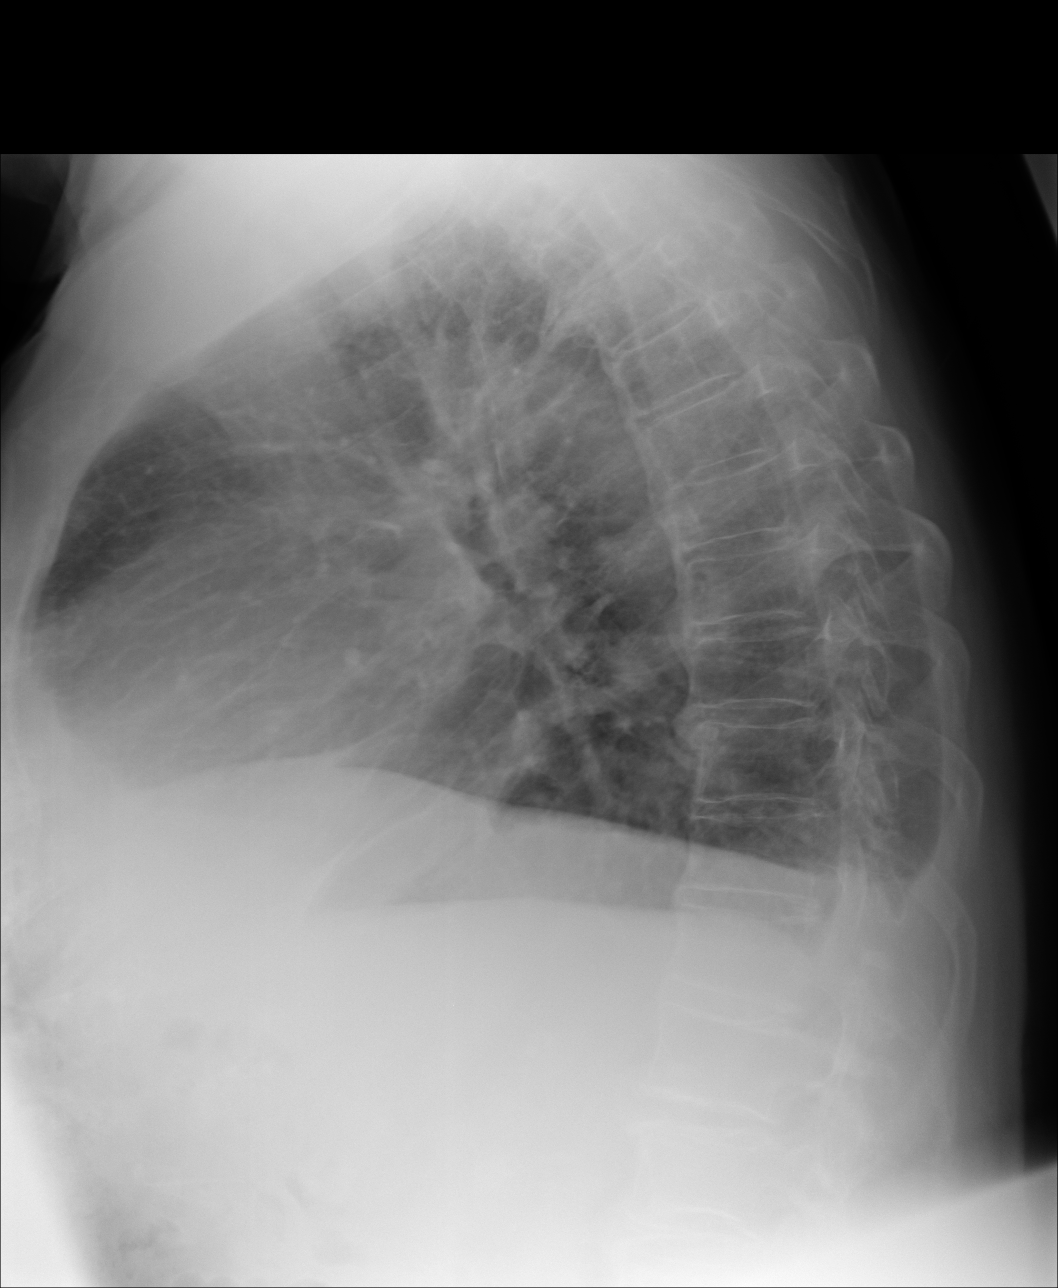

[3 of 3 positions shown; findings below may reference images not displayed]

PROCEDURE:     KDR - KDXR CHEST PA (OR AP) AND LAT  - October 17, 2011 [DATE]

RESULT:     There is apparent elevation of the right hemidiaphragm. The
possibility of a small subpulmonic effusion on the right cannot be excluded
on the basis of this study. This could be further evaluated by a lateral
decubitus view if clinically indicated. The lung fields are clear of
infiltrate. Heart size is within the limits of normal. No acute bony
abnormalities are seen.
IMPRESSION: 1. Possible subpulmonic effusion on the right versus elevation of the right
hemidiaphragm. As noted above, this could be further evaluated by lateral
decubitus views if clinically indicated.
2. The lung fields are clear of infiltrate.
3. No acute changes of the heart or pulmonary vascularity are seen.

## 2013-08-04 IMAGING — CR DG CHEST 2V
1 series · 2 of 2 positions shown · non-contrast
Comparison: none

REASON FOR EXAM: cough
COMMENTS:

[Series 1: pa · 0.17mm/px · 2 of 2 slices shown]
[im 1/2]
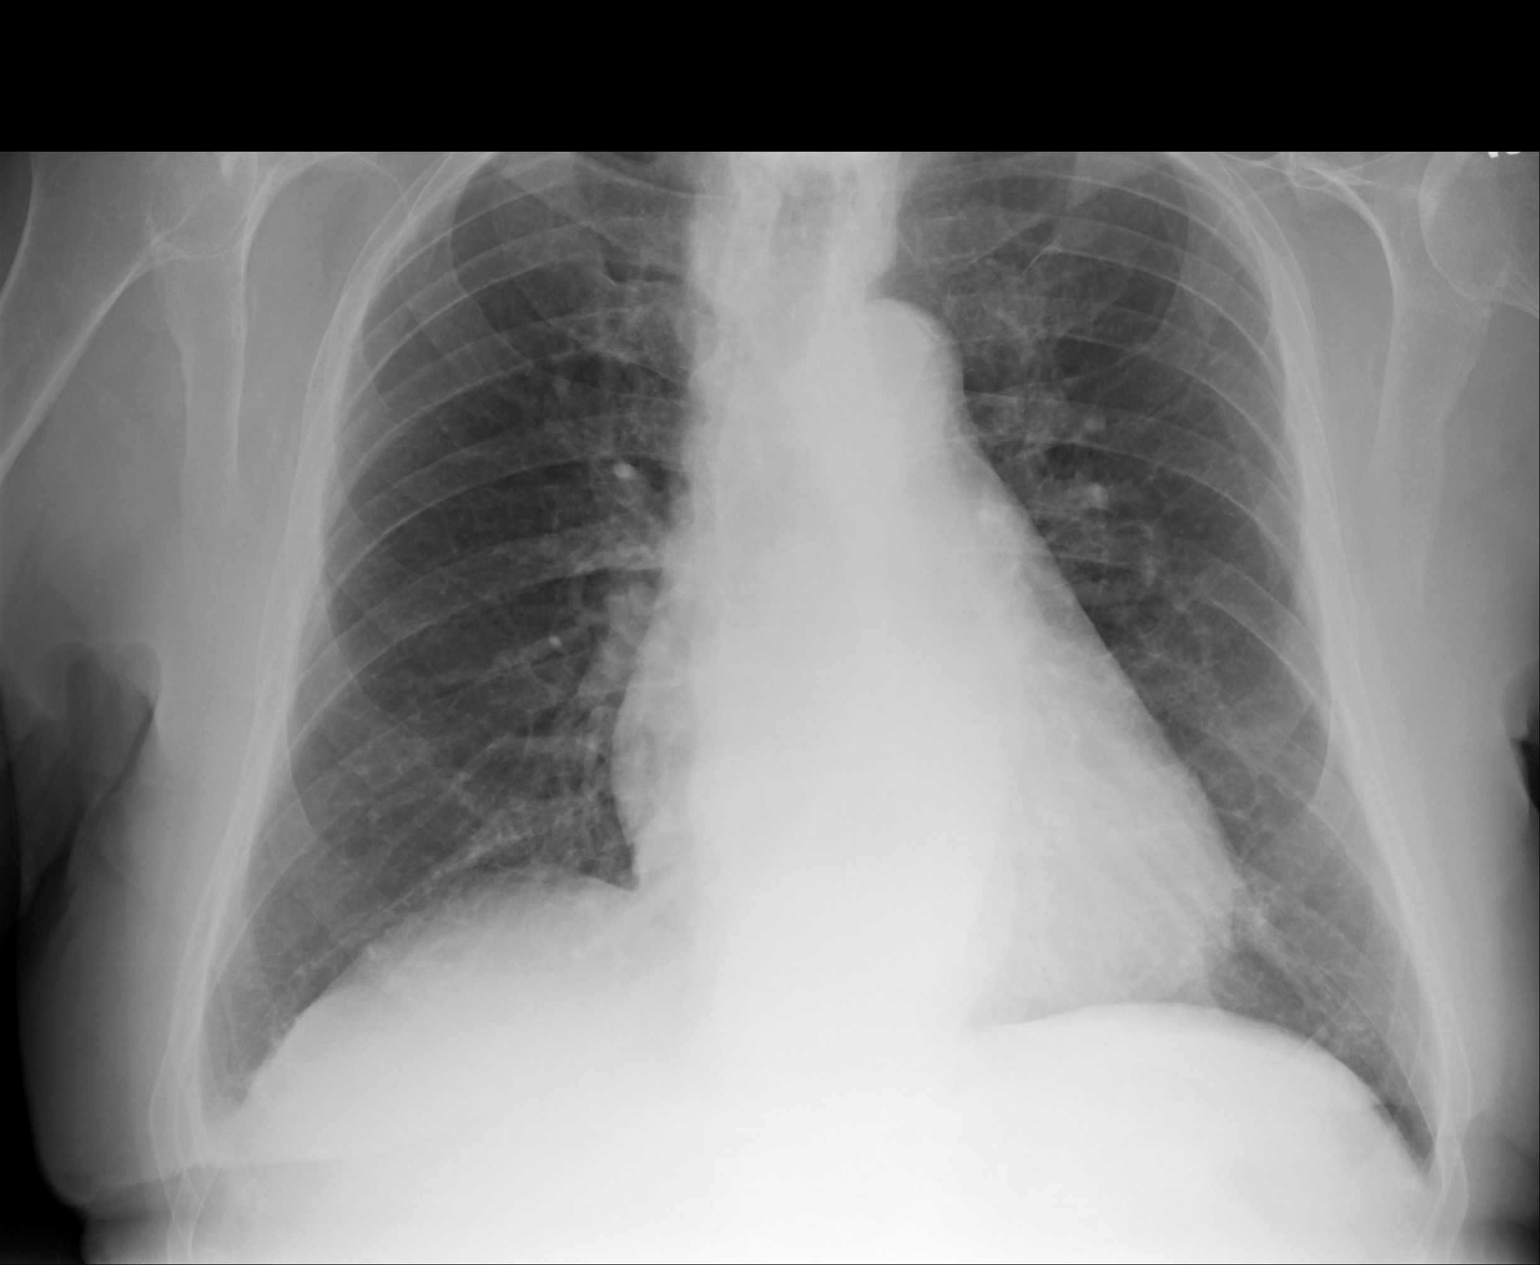
[im 2/2]
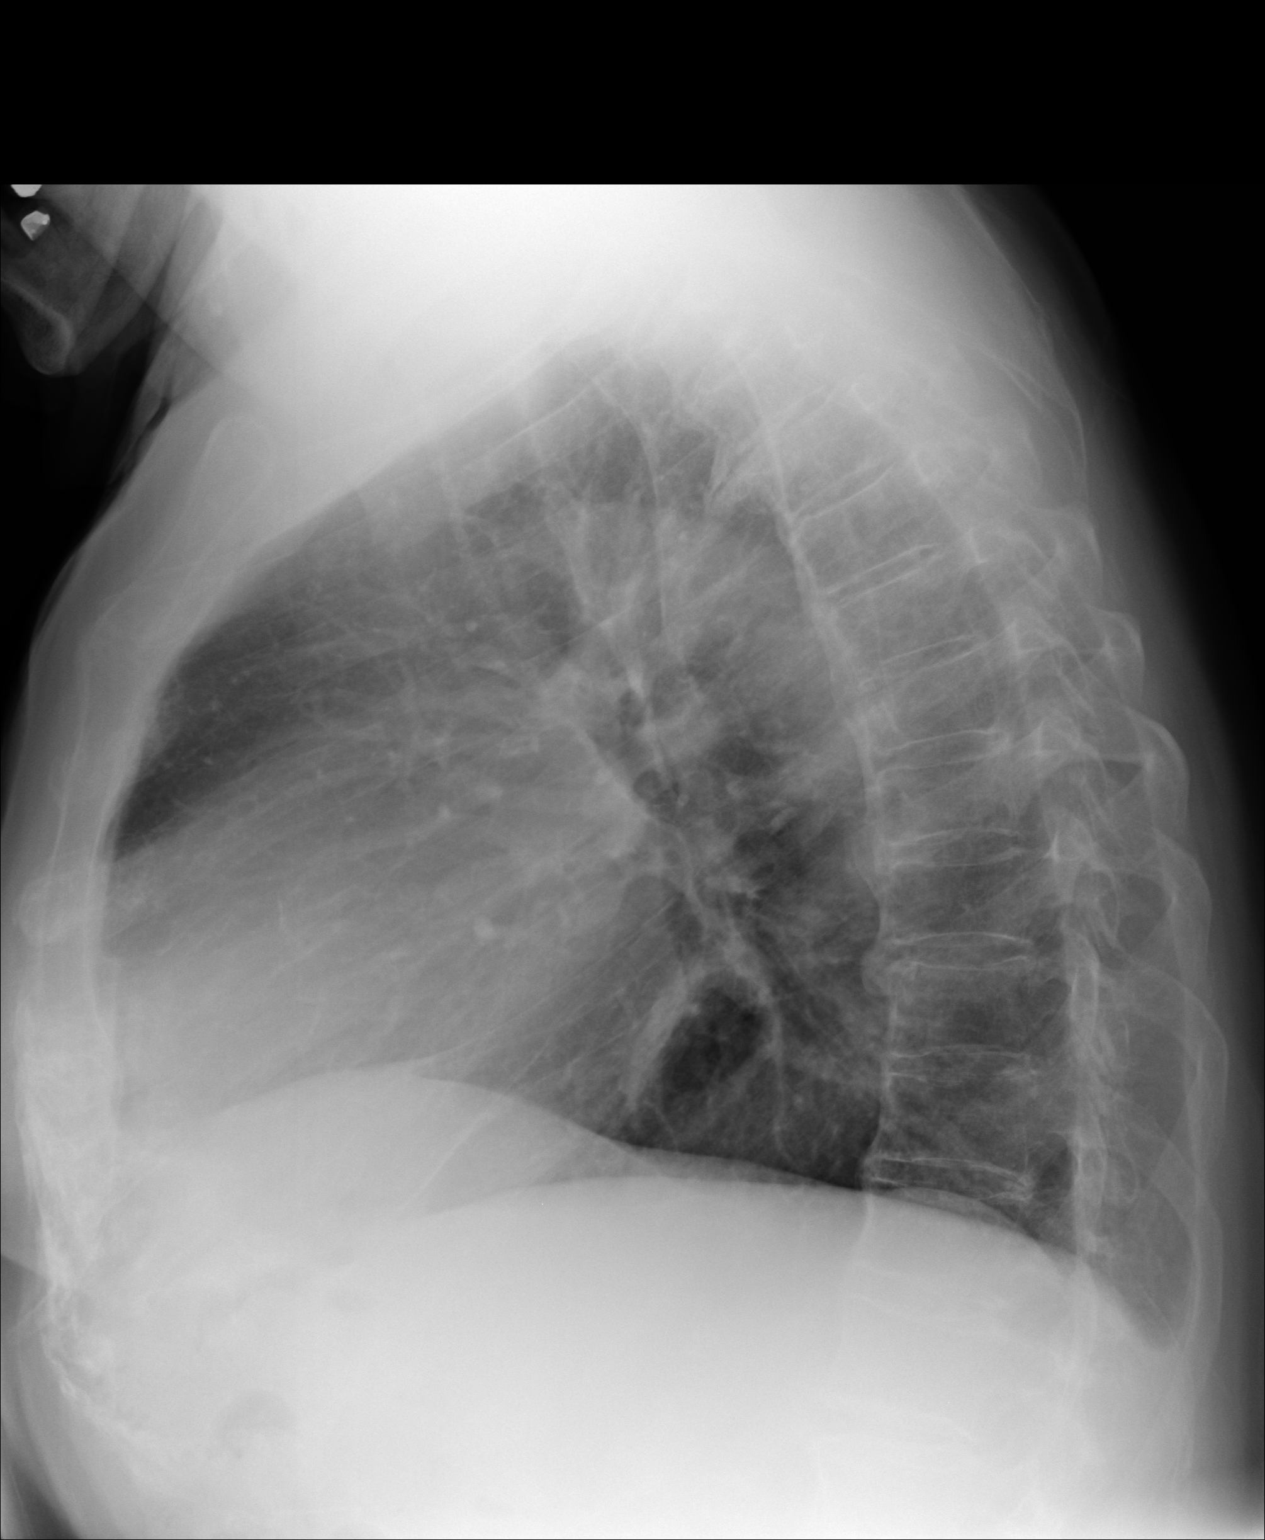

[2 of 2 positions shown; findings below may reference images not displayed]

PROCEDURE:     KDR - KDXR CHEST PA (OR AP) AND LAT  - November 04, 2011 [DATE]

RESULT:     Comparison is made to the prior exam of 10/17/2011. The lung
fields are clear. No pneumonia, pneumothorax or pleural effusion is seen.
Heart size is upper limits for normal. The mediastinal and osseous
structures show no significant abnormalities.
IMPRESSION: 1. The lung fields are clear. The small right pleural effusion noted on the
exam of 10/17/2011 has now cleared.
2. No new infiltrates are seen.
3. The heart is upper limits for normal in size.

## 2013-11-10 IMAGING — CR RIGHT FOOT COMPLETE - 3+ VIEW
1 series · 4 of 4 positions shown · non-contrast
Comparison: none

REASON FOR EXAM: atraumatic swelling
COMMENTS:

PROCEDURE:     DXR - DXR FOOT RT COMPLETE W/OBLIQUES  - February 10, 2012 [DATE]
RESULT:     No fracture or dislocation is identified. No lytic or blastic
lesions are seen. No soft tissue foreign body is identified.

[Series 5: x foot ap right · 0.14mm/px · 4 of 4 slices shown]
[im 1/4]
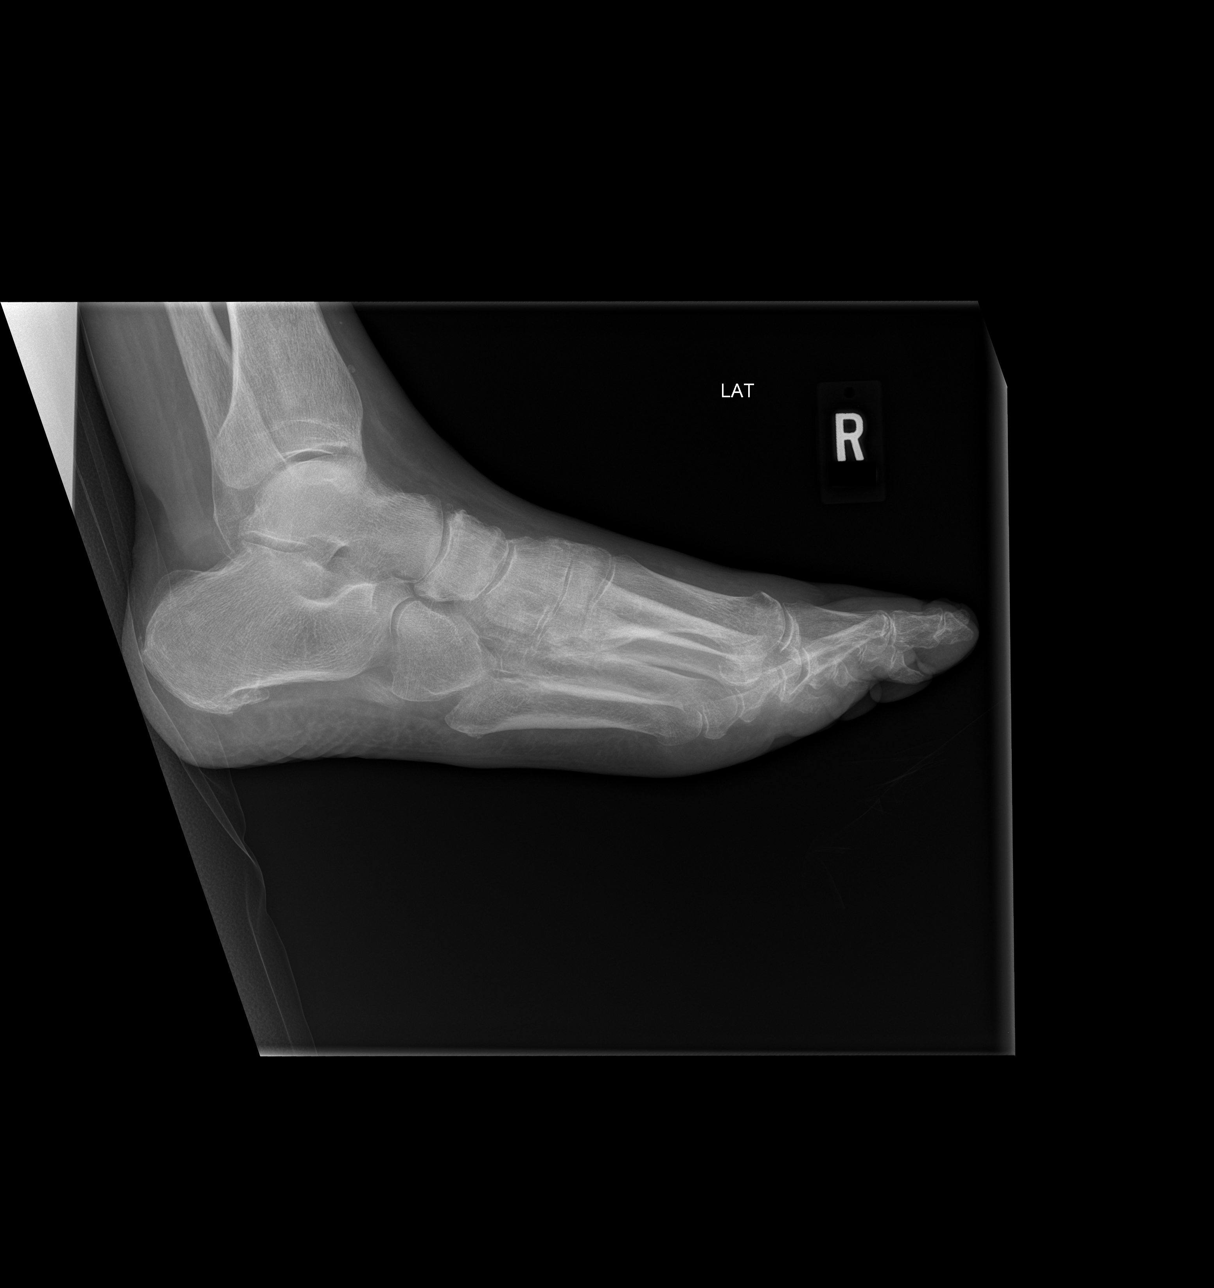
[im 2/4]
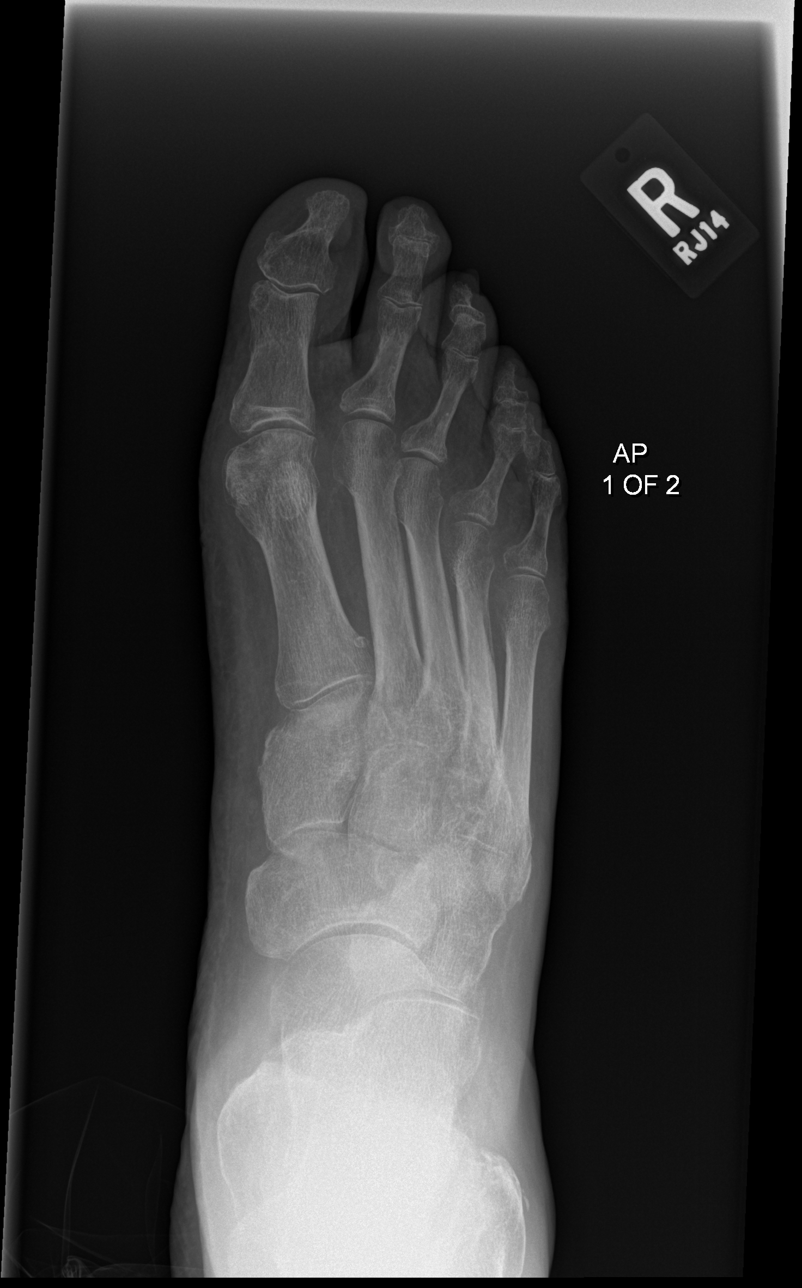
[im 3/4]
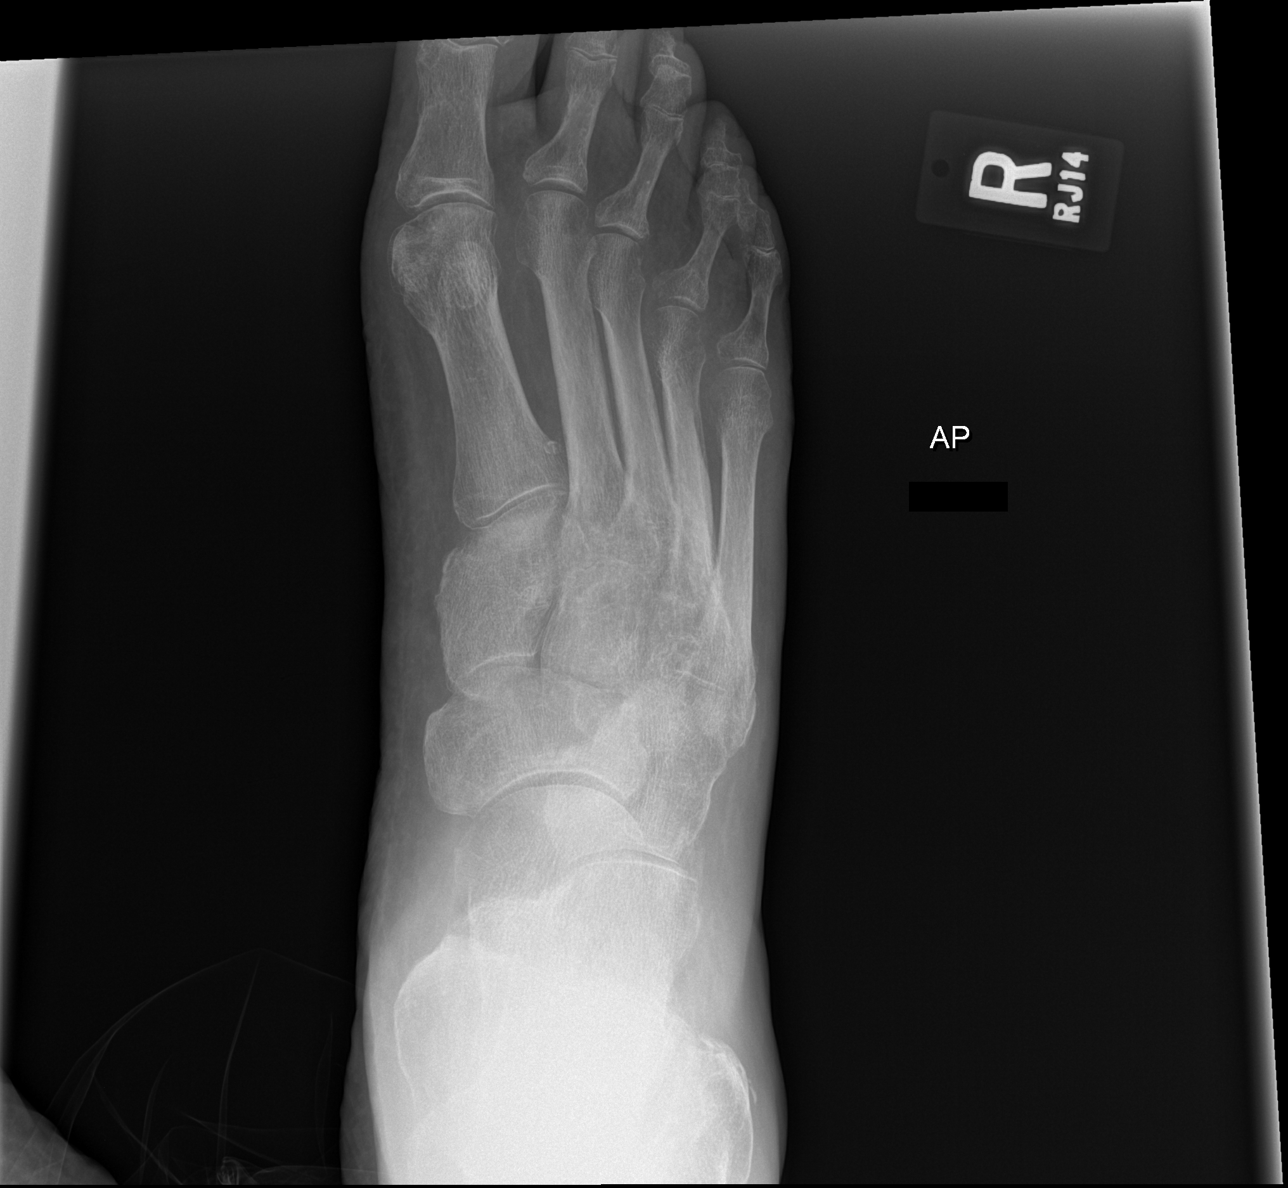
[im 4/4]
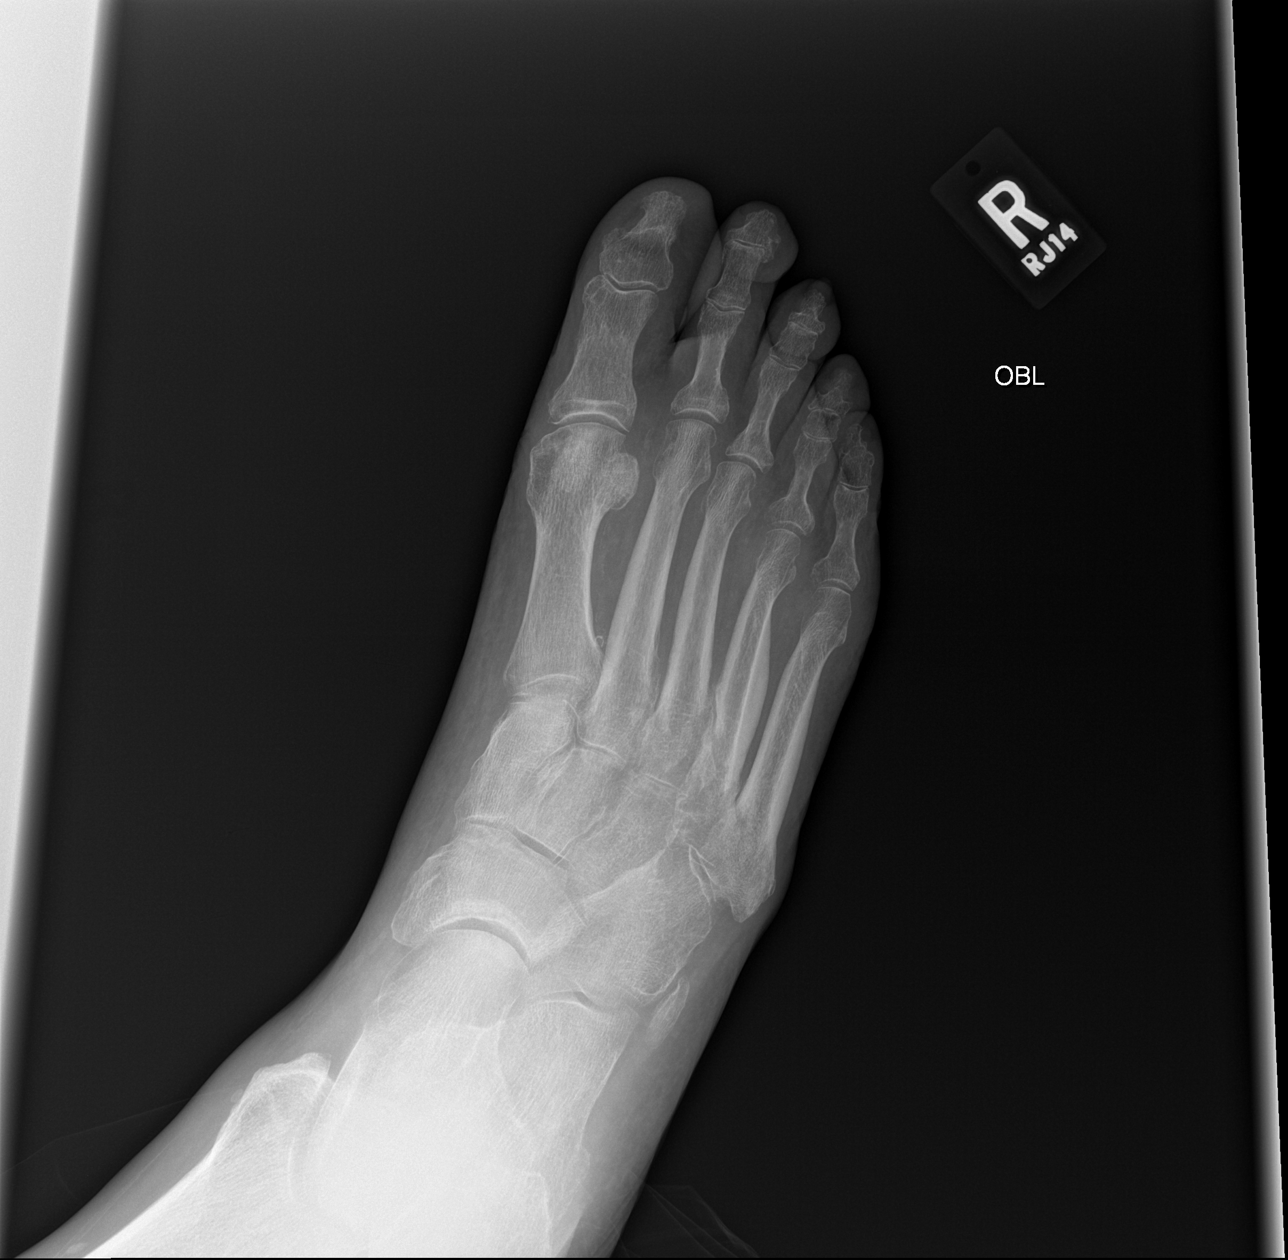

[4 of 4 positions shown; findings below may reference images not displayed]

IMPRESSION: No acute changes are identified.

[REDACTED]

## 2014-12-27 NOTE — Consult Note (Signed)
PATIENT NAME:  [name], Shamari MR#:  [id number] DATE OF BIRTH:  November 20, 1916  DATE OF CONSULTATION:  03/30/2012  REFERRING PHYSICIAN:  Enid Baasadhika Kalisetti, MD  CONSULTING PHYSICIAN:  Rosalyn GessMichael E. Jerell Demery, MD  REASON FOR CONSULTATION: MRSA bacteremia.   HISTORY OF PRESENT ILLNESS: The patient is a 81 year old white man with a past history significant for diabetes and recent right hip fracture status post ORIF who was discharged from rehab several days prior to admission. He had a fall at home and presented to the Emergency Room. Imaging of his head and left shoulder, which was causing pain, did not show any significant abnormalities. He went back home but was having worsening weakness and was unable to walk. He began having fevers with a temperature of 102.8 at home and was brought back to the Emergency Room and subsequently admitted on 07/18. The patient states that he had a fall while walking to the kitchen. He states that he does not recall tripping. He feels that he slid to the floor. He was a fairly good historian in terms of answering questions, however, he had trouble giving me the exact circumstances of what was going on at home prior to his admission. He states that he has had some cough but has not had any production. He has not had significant nausea, vomiting, or change in his bowels. He has had no sinus congestion or sore throat. He is in the CCU due to hypotension. His creatinine has been slightly elevated when it was normal in the past. The patient is currently on vancomycin after having initially been given azithromycin, vancomycin, and Zosyn.   ALLERGIES: None.   PAST MEDICAL HISTORY:  1. Diabetes.  2. Hypertension.  3. MI.  4. Degenerative joint disease.  5. Status post right total knee replacement in 2007.  6. Venous insufficiency.  7. Peripheral vascular disease.  8. Atrial fibrillation/flutter.  9. Hypercholesterolemia.  10. Back surgery in 2010.  11. Recent right hip fracture  status post ORIF.   FAMILY HISTORY: Positive for diabetes and hypertension.   SOCIAL HISTORY: The patient had recently been discharged from rehab and was living with his daughter. He does not smoke. He drinks wine rarely. No injecting drug use history. He has no pets at home.   REVIEW OF SYSTEMS: GENERAL: Positive fevers, malaise, and general weakness. HEENT: No headaches. No sinus congestion. No sore throat. Some dry mouth. NECK: No swollen glands. No stiffness. CARDIAC: No chest pains or palpitations. No peripheral edema. RESPIRATORY: Some cough without sputum production. No significant shortness of breath. GI: No nausea, no vomiting, no abdominal pain, no change in his bowels. GU: No change in his urine. MUSCULOSKELETAL: He has had general weakness but no focal weakness. He has left shoulder pain since his fall. Pain is mainly along the top of the shoulder. He has decreased range of motion due to his pain in the left shoulder. His other joints including the recently operated right hip are doing well. NEUROLOGICAL: No focal weakness other than the left shoulder due to pain. He denies any confusion. He denies any loss of consciousness. He has generalized weakness, however. PSYCHIATRIC: No complaints. All other systems are negative.   PHYSICAL EXAMINATION:   VITAL SIGNS: T-max 99.6, T-current 97.6, pulse 96, blood pressure 114/49, 92% on 3 liters.   GENERAL: 81 year old white man in no acute distress.   HEENT: Normocephalic, atraumatic. Pupils equal, reactive to light. Extraocular motion intact. Sclerae, conjunctivae, and lids are without evidence for emboli or petechiae. Oropharynx  shows no erythema or exudate. Gums are in fair condition.   NECK: Supple. Full range of motion. Midline trachea. No lymphadenopathy. No thyromegaly.   CHEST: Clear to auscultation bilaterally with good air movement. No focal consolidation.   CARDIAC: Regular rate and rhythm without murmur, rub, or gallop.   ABDOMEN:  Soft, nontender, nondistended. No hepatosplenomegaly. No hernia is noted.   EXTREMITIES: No evidence for tenosynovitis. He had pain in the left shoulder that was mainly in the neck muscles and then anteriorly along the biceps tendon. He was able to tolerate passive range of motion to about 90 degrees and then developed pain. He had some discomfort in the shoulder with external rotation but internal rotation did not bother him. There was no warmth to the shoulder. There was no significant swelling and there did not appear to be an effusion present.   NEUROLOGIC: The patient was awake and interactive moving all four extremities, although limited in the left upper extremity.   PSYCHIATRIC: Mood and affect appeared normal.   LABORATORY DATA: BUN 30, creatinine 1.48, bicarbonate 18, anion gap 10, white count 8.1 with hemoglobin 8.7, platelet count 145, ANC 6.9. His white count on admission was 9.4. Blood cultures from admission are growing Methicillin-sensitive Staphylococcus aureus in four of four bottles. Repeat blood cultures from 07/21 are pending. A urinalysis from 07/18 was unremarkable.   An echocardiogram from 07/19 showed a mildly dilated left atrium, trace aortic regurgitation, mild to moderate mitral regurgitation, moderate tricuspid regurgitation, EF of greater than 55%. There were no notes made of any vegetations noted, although the mitral valve and the tricuspid valve were not well visualized.  A CT of the head without contrast on admission showed no acute intracranial process.   Left shoulder films showed no obvious fracture.   Chest x-ray showed CHF and pulmonary interstitial edema.   Renal ultrasound showed no evidence of obstruction. There was a simple cyst in the midpole of the left kidney.   A chest x-ray from 07/20 showed congestive heart failure with pulmonary edema and bilateral pleural effusions that had progressed.  Right hip x-ray showed no abnormality.   Chest x-ray from  today showed atelectasis versus infiltrate in the lung bases as well as a possible small effusion and some increased interstitial findings representing a component of pulmonary edema.   IMPRESSION: This is a 81 year old white man with a past history significant for diabetes who admitted with MRSA bacteremia.   RECOMMENDATIONS:  1. No clear source for his bacteremia. He had a mild cough that is not productive but his chest x-ray does not show an obvious infiltrate and appears to be more consistent with CHF.  2. I agree with vancomycin.  3. Transthoracic echocardiogram did not note any vegetations but there was mitral regurgitation. The tricuspid and mitral valves were not well visualized. Will get a TEE.  4. Repeat blood cultures were drawn yesterday and are pending.  5. If the blood cultures remain negative, will arrange for PICC line placement. He will need 2 to 4 weeks of therapy depending on results of the TEE.  6. His left shoulder is tender but does not appear to be warm or fluctuant.   This is a high level Infectious Disease consult. Thank you very much for involving me in Mr. Bradt's care.   ____________________________ Rosalyn Gess. Adler Alton, MD meb:drc D: 03/30/2012 12:53:41 ET T: 03/30/2012 13:07:16 ET JOB#: [id number]  cc: Rosalyn Gess. Crawford Tamura, MD, <Dictator> Josslin Sanjuan E Patrisia Faeth MD ELECTRONICALLY SIGNED 03/31/2012 8:07

## 2014-12-27 NOTE — Consult Note (Signed)
General Aspect patient is a 81 year old male with history of a right femoral neck fracture recently who was surgically repaired and discharged to rehabilitation facility.  He was discharged to home from that facility and suffered a fall.  He presented to the emergency room where head CT was unremarkable.  He had no fractures.  He was discharged to home from this but returned after being unable to ambulate as well as a fever of approximately 103.  He had a nonproductive cough.  He was also noted to have acute renal failure with a serum creatinine of 1.69.  Patient has a prior history of atrial fibrillation treated with rate control with Cardizem and metoprolol as well as digoxin.  He has a past history of myocardial infarction.  He was treated with IV fluids and placed on pressors do to hypotension.  He underwent blood cultures and was placed on antibiotics empirically.  The patient is rate controlled with Cardizem, digoxin and beta blockers but was not on warfarin secondary to fall risk.  His beta blockers and Cardizem were held due to hypotension.  He was noted to have a mild serum troponin elevation.  Patient is a difficult historian but denies chest pain.    Present Illness as per above   Physical Exam:   GEN thin, disheveled    HEENT PERRL    NECK supple    RESP normal resp effort    CARD Irregular rate and rhythm  Murmur    Murmur Systolic    Systolic Murmur axilla    ABD denies tenderness  normal BS    LYMPH negative neck    EXTR negative cyanosis/clubbing, negative edema    SKIN normal to palpation    NEURO cranial nerves intact, motor/sensory function intact    PSYCH poor insight   Review of Systems:   Subjective/Chief Complaint weakness and fatigue    General: Weight loss or gain  Fatigue  Fever/chills  Weakness    Skin: No Complaints    ENT: No Complaints    Eyes: No Complaints    Neck: No Complaints    Respiratory: Short of breath    Cardiovascular: No  Complaints    Gastrointestinal: No Complaints    Genitourinary: No Complaints    Vascular: No Complaints    Musculoskeletal: No Complaints    Neurologic: No Complaints    Hematologic: No Complaints    Endocrine: No Complaints    Psychiatric: No Complaints    Review of Systems: All other systems were reviewed and found to be negative    Medications/Allergies Reviewed Medications/Allergies reviewed     osteoporosis:    DM:    arthritis:    right hip surgery:    tonsillectomy: 81 years old   left knee arthroscopy:    back surgery: 2002   appendectomy: 1934   right total knee repacement: 15-Apr-2006  Home Medications: Medication Instructions Status  Diltiazem Hydrochloride CD 120 mg/24 hours oral capsule, extended release 1 cap(s) orally once a day for htn/heart (8 am) Active  Voltaren Topical 1% topical gel Apply topically to neck 3 times a day, As Needed- for Pain  Active  Advair Diskus 250 mcg-50 mcg inhalation powder 1 puff(s) inhaled 2 times a day  Active  citalopram 20 mg oral tablet 1 tab(s) orally once a day  Active  clopidogrel 75 mg oral tablet 1 tab(s) orally once a day  Active  docusate sodium 100 mg oral capsule 1 cap(s) orally 2 times a  day  Active  ferrous sulfate 325 mg (65 mg elemental iron) oral tablet 1 tab(s) orally 2 times a day Active  glipiZIDE 2.5 mg oral tablet, extended release 1 tab(s) orally once a day Active  Lanoxin 125 mcg (0.125 mg) oral tablet 1 tab(s) orally once a day  Active  metoprolol succinate 25 mg oral tablet, extended release 1 tab(s) orally once a day Active  simvastatin 20 mg oral tablet 1 tab(s) orally once a day (at bedtime)  Active  Vitamin D3 2000 intl units oral tablet 1 tab(s) orally once a day  Active  colchicine 0.6 mg oral tablet 1 tab(s) orally 2 times a day  Active   EKG:   EKG NSR    Interpretation atrial fibrillation with variable ventricular response    No Known Allergies:     Impression  81 year old male with chronic atrial fibrillation status post right femoral neck fracture who was admitted after returning home from rehabilitation and suffering a fall followed by a fever of near 103.  He was hypotensive continue to be in nature fibrillation with rapid ventricular response.  He also is mild renal insufficiency he has not been chronically anticoagulated secondary to fall risk on anything other than Plavix.  He currently is hypotensive and therefore his Cardizem and metoprolol were held.  He is on digoxin also for rate control.  He has a mild serum troponin elevation which appears to be secondary to increased myocardial demand and renal insufficiency.  Given the scenario, acute coronary event is not appear to be the etiology.  Patient is not a candidate for invasive evaluation secondary to comorbid conditions.    Plan 1.  Continue to attempt to wean pressors as blood pressure tolerates 2.  Continue to hold Cardizem and metoprolol until blood pressure allows 3.  Careful use of digoxin following renal function 4.  Would not chronically anticoagulated secondary to fall risk 5.  Continue with empiric antibiotics 6.  Further recommendations pending course   Electronic Signatures: Dalia Heading (MD)  (Signed 19-Jul-13 15:33)  Authored: General Aspect/Present Illness, History and Physical Exam, Review of System, Past Medical History, Home Medications, EKG , Allergies, Impression/Plan   Last Updated: 19-Jul-13 15:33 by Dalia Heading (MD)

## 2014-12-27 NOTE — Consult Note (Signed)
Brief Consult Note: Diagnosis: left shoulder pain, status post right hip prosthesis.   Patient was seen by consultant.   Recommend further assessment or treatment.   Orders entered.   Comments: 81 year old male fell a 50few days ago and complains of left shoulder pain.  Admitted for fever of 103*.  Also had right hip hemiarthroplasty 02/13/12 and just returned home.  Now feels better.  Exam:  alert and oriented, sitting up in bed.  left shoulder tender to touch.  no bruising.  range of motion good with mild pain.  circulation/sensation/motor function good distally. right hip range of motion good.  wound healed with no reddness or warmth.  stable.  circulation/sensation/motor function good.  some residual bruising.  X-rays:  left shoulder-- no fracture or dislocation.  osteoarthritis acromioclavicular joint   Imp:  contusion left shoulder         status post right hip hemiarthroplasty  Rx:   ice left shoulder          ambulate weight bearing as tolerated right with walker.  Electronic Signatures: Valinda HoarMiller, Iley Breeden E (MD)  (Signed 20-Jul-13 13:14)  Authored: Brief Consult Note   Last Updated: 20-Jul-13 13:14 by Valinda HoarMiller, Atari Novick E (MD)

## 2014-12-27 NOTE — Consult Note (Signed)
PATIENT NAME:  [name] [name], [name] [name] MR#:  [id number] DATE OF BIRTH:  26-May-1917  DATE OF CONSULTATION:  04/01/2012  REFERRING PHYSICIAN:   CONSULTING PHYSICIAN:  Rhona RaiderMatthew G. Kashten Gowin, DPM  HISTORY OF PRESENT ILLNESS: The patient is a 81 year old male admitted to the hospital because of MRSA septicemia of uncertain origin or original source for the infection. He has had a hip fracture following a fall which has been fairly recent and has been in rehab and at this time has been discharged from rehab to home this past week but developed shortness of breath and coughing and was readmitted to the hospital at that point. I am consulted because he has an ulcer on the back of his right heel with necrotic eschar around the region as well.   PAST MEDICAL HISTORY:  1. Diabetes.  2. Hypertension.  3. Remote myocardial infarction. 4. Chronic bilateral venous insufficiency.  5. Lower extremity peripheral artery disease.  6. Atrial fibrillation. 7. History of back surgery. 8. Cataract surgery. 9. Right knee replacement. 10. Hip surgery.   FAMILY HISTORY: Positive for hypertension and diabetes.   SOCIAL HISTORY: Denies alcohol. Denies smoking.  HOME MEDICATIONS: Advair, Citalopram, Plavix, diltiazem, docusate, ferrous sulfate, glipizide, digoxin, metoprolol, simvastatin, colchicine, Voltaren gel, and also vitamin D.   ALLERGIES: No known drug allergies.  PHYSICAL EXAMINATION:   GENERAL: He is stable at this point. He is alert and is able to answer questions and talk.  VITALS: His last temperature was 98.1, pulse 92, respirations 18, blood pressure 109/69, and pulse oximetry 94.  LOWER EXTREMITY EXAM: Vascular DP and PT pulses are difficult to palpate bilaterally. He does have some varicosities and venous changes to both feet and legs with color changes to both his feet and legs as well.   DERMATOLOGIC: The patient does have an ulceration on the posterior right heel. It is about 2.5 x 3.5 cm in its size  with a significant dried black eschar on the region, uncertain as to how deep this penetrates. There is a little bit of blister formation proximal to that. There is no erythema or redness to this region. He states he has had the ulceration for about a month. As noted, there is no cellulitis or evidence of progressive infection in this particular region, so it is unlikely this is the source of his bacteremia.   CLINICAL IMPRESSION: Decubitus ulcer of right heel with necrotic eschar.   TREATMENT PLAN: I and going to go ahead and start him with Santyl ointment for debridement and to soften this eschar up. Use wet to dry saline and heavy padded dressing. Continue to wear the heel protector boots at all times. I will see him back in my office in a couple of weeks and see if we can debrided that off. I do not think it needs any type of specific debridement at this point because there is no surrounding cellulitis, but need to keep a close eye on it. If it changes, they should give my office a call and we will get him in sooner. Otherwise, I will check him in two weeks. If that is unsuccessful or fails to soften it up enough to debride it in the office, then we may have to consider a debridement in the operating room, but we will try to see how well he does with the Santyl treatment.  ____________________________ Rhona RaiderMatthew G. Kamira Mellette, DPM mgt:slb D: 04/01/2012 13:00:48 ET    T: 04/01/2012 13:45:31 ET       JOB#: [id number] cc:  Rhona Raider Jarielys Girardot, DPM, <Dictator> Epimenio Sarin MD ELECTRONICALLY SIGNED 04/06/2012 13:16

## 2014-12-27 NOTE — H&P (Signed)
PATIENT NAME:  [name] [name], [name] [name] MR#:  [id number] DATE OF BIRTH:  Mar 19, 1917  DATE OF ADMISSION:  03/26/2012  PRIMARY CARE PHYSICIAN:   Dr. Beverely Risen  REFERRING PHYSICIAN: Dr. Carollee Massed   CHIEF COMPLAINT: Weakness and fever.   HISTORY OF PRESENT ILLNESS: This is a 81 year old male status post right femoral neck hip fracture with repair who was discharged to subacute rehab. The patient was discharged home from rehab a couple of days ago. The patient presented yesterday to the Emergency Room status post fall where he had a CT of the head without contrast that did not show any acute intracranial process and his left shoulder x-ray did not show any acute fracture or dislocation. The patient was discharged home thereafter, but as daughter reports the patient complained of progressive weakness after the fall.  He could not ambulate over the last 24 hours, and she noticed him having a fever with a maximum temperature over 102 .8 today, which prompted her to bring him to the Emergency Department.   The patient complains of cough, nonproductive, over the last 2 to 3 days. Denies any polyuria or dysuria. The patient's urinalysis was negative but the patient's chest x-ray did show evidence of volume overload. Retrocardiac opacity could not be ruled out. Blood cultures were sent. The patient was started on IV vancomycin and Zosyn for presumed healthcare-acquired pneumonia.  The patient was hypotensive and received a total of 2 liters in the ED but he remained with systolic blood pressure in the low 80s. As well the patient was noted to have borderline elevated troponins, but he denies any chest pain or shortness of breath.   The patient was found to be in acute renal failure with creatinine of 1.69, which was essentially within normal limits when he was discharged. As well, the patient's platelet count has been on the lower side of 121, which appears to be chronic. Also, the patient is known to have history of atrial  fibrillation where he is on Cardizem and metoprolol and digoxin for that. The patient's digoxin level was within normal limits. The patient denies any focal deficits, any slurred speech, any altered mental status, any chest pain, any shortness of breath.   PAST MEDICAL/SURGICAL HISTORY: 1. Diabetes mellitus.  2. Hypertension.  3. History of remote mild myocardial infarction in the past.  4. Degenerative joint disease status post right knee replacement 2007.  5. Chronic bilateral lower extremity venous insufficiency.  6. History of peripheral vascular disease.  7. History of chronic atrial fibrillation and flutter.  8. Hyperlipidemia.  9. Back surgery 2010.  10. Cataract surgery.  11. Right knee replacement.   FAMILY HISTORY:  Remarkable for diabetes and hypertension.   SOCIAL HISTORY: Denies any smoking, alcohol, or illicit drug use.   HOME MEDICATIONS:  1. Advair 250/50 1 puff b.i.d.  2. Citalopram 20 mg daily.  3. Plavix 75 mg daily.  4. Diltiazem 120 mg daily.  5. Docusate 100 mg 2 times a day.  6. Ferrous sulfate 325 mg twice a day.  7. Glipizide 2.5 mg daily.  8. Digoxin 0.125 mg daily.  9. Metoprolol 25 mg daily.  10. Simvastatin 20 mg at bedtime.  11. Vitamin D3 2000 units.  12. Colchicine 0.6 mg 2 times a day.  13. Voltaren gel.   ALLERGIES: No known drug allergies.   REVIEW OF SYSTEMS: The patient denies any weight loss or weight gain. Complains of fever 102.8, fatigue, and weakness. EYES: Denies blurry vision, double vision, or pain.  ENT: Denies tinnitus, ear pain, or hearing loss. RESPIRATORY: Complains of cough, nonproductive. Denies any wheezing, hemoptysis, or dyspnea. CARDIOVASCULAR: Denies any chest pain. Has chronic lower extremity edema. Denies any arrhythmia, but has history of atrial fibrillation. Denies any palpitations.  GI: Denies nausea, vomiting, diarrhea, or abdominal pain. GU: Denies dysuria, hematuria, or renal colic. ENDO: Denies polyuria, polydipsia,  or heat or cold intolerance. HEMATOLOGIC: Denies any easy bruising, bleeding diathesis, or history of blood clots. INTEGUMENT: Denies any acne. Has right heel decubitus ulcer. NEUROLOGIC: Denies any numbness, dysarthria, epilepsy, tremors, or vertigo. MUSCULOSKELETAL: Denies any back pain. Has neck pain and shoulder pain from fall yesterday. PSYCH: Denies any anxiety, schizophrenia, or nervousness.   LABORATORY DATA: Glucose 171, BUN 32, creatinine 1.69, sodium 135, potassium 3.9, chloride 102, CO2 22. Troponin 0.17, digoxin 1.08. White blood cells 9.4, hemoglobin 9.6, hematocrit 29.1, platelets 121.  Urinalysis negative.   Chest x-ray shows interstitial pulmonary edema pattern where retrocardiac opacity could not be ruled out.   PHYSICAL EXAMINATION:  VITAL SIGNS: Temperature 96.6, pulse 88, respiratory rate 20, blood pressure 85/50, saturating 94% on oxygen.   GENERAL: Frail, elderly male who is comfortable and in no apparent distress.   HEENT: Head atraumatic, normocephalic. Pupils equal, reactive to light. Pink conjunctivae. Anicteric sclerae. Moist oral mucosa.   NECK: Supple. No thyromegaly. No JVD.   CHEST: Good air entry bilaterally. No wheezing, rales, or rhonchi.   CARDIOVASCULAR: S1, S2 heard. No rubs, murmurs, or gallops. Irregularly irregular rhythm.   ABDOMEN: Soft, nontender, nondistended. Bowel sounds present.   EXTREMITIES: +2 bilateral pitting edema. Good capillary refill. No clubbing, no cyanosis.   SKIN: Has right heel un-stageable pressure ulcer with a couple of bruises in the right thigh area and left arm and forearm.   PSYCHIATRIC: Appropriate affect. Awake, alert, oriented times three. Intact judgment and insight.   NEUROLOGIC: Cranial nerves grossly intact. Sensation symmetrical. Motor five out of five in all extremities.   ASSESSMENT AND PLAN: This is a 81 year old male who presents with complaints of weakness and fever of 102.8, as well presents with  hypotension with mild improvement with a total of 2 liters of fluid.  1. Hypotension: Etiology is unclear. Infection versus congestive heart failure. We will continue with IV fluids. We will bolus him another 500 mL per hour. We will obtain pro BNP to evaluate for congestive heart failure. We will obtain 2-D echo. The patient had mildly elevated troponins but denies any chest pain, but we will cycle three sets of cardiac enzymes. Discussed with the family the issue of possible need of central line. At this point a decision was not made.  They want to try antibiotics and IV fluid resuscitation initially. If not much improvement, we will discuss again about possible need of central line.  2. Source of infection: Not clear at this point, but it is very likely healthcare-acquired pneumonia even though no definite infiltrate can be seen on the chest x-ray, but it is of poor quality and retrocardiac infiltrate cannot be ruled out. With the patient's significant fever and cough, we will start him on IV vancomycin and Zosyn for healthcare-acquired pneumonia. Blood cultures were sent.  3. Atrial fibrillation, chronic: On Plavix for anticoagulation. High risk for fall so no warfarin. Currently rate controlled. We will certainly hold beta blocker and Cardizem as the patient is hypotensive. We will resume digoxin when acute renal failure improves.  4. Acute renal failure: We will continue with IV fluids. We will repeat BMP  in the a.m.  5. Elevated troponin: The patient denies any chest pain. This is most likely due to his acute renal failure. We will cycle three sets of troponins.  6. Diabetes mellitus: We will have the patient on insulin sliding scale. We will hold glipizide.  7. Deep vein thrombosis prophylaxis: Secondary to his baseline chronic thrombocytopenia we will have him on sequential compression device.  8. CODE STATUS: Patient is DO NOT RESUSCITATE/DO NOT INTUBATE.   TOTAL TIME SPENT ON PATIENT CARE: 55  minutes.   ____________________________ Starleen Arms, MD dse:bjt D: 03/26/2012 19:33:11 ET T: 03/27/2012 08:19:46 ET JOB#: [id number]  cc: Starleen Arms, MD, <Dictator> Lyndon Code, MD Dianne Whelchel Teena Irani MD ELECTRONICALLY SIGNED 03/27/2012 13:27

## 2014-12-27 NOTE — Discharge Summary (Signed)
PATIENT NAME:  [name], [name] MR#:  [id number] DATE OF BIRTH:  [birth year]  DATE OF ADMISSION:  03/26/2012 DATE OF DISCHARGE:  04/07/2012  ADMITTING PHYSICIAN: Dr. Huey Bienenstock  DISCHARGING PHYSICIAN: Dr. Enid Baas  PRIMARY CARE PHYSICIAN: Dr. Beverely Risen   CONSULTATIONS IN THE HOSPITAL:  1. Infectious disease consultation by Dr. Orson Aloe.  2. Cardiology consultation by Dr. Lady Gary.  3. Orthopedic consultation by Dr. Deeann Saint and also Dr. Martha Clan.  4. Wound care consult.  5. Podiatry consultation by Dr. Orland Jarred.   DISCHARGE DIAGNOSES:  1. 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  2. Septic shock.  3. Pneumonia.  4. Acute renal failure on admission.  5. Hematuria.  6. Urinary retention requiring a foley catheter  7. Sacral ulcer stage II.  8. Right foot heel ulcer which is chronic with black eschar formed.  9. Elevated troponin on admission due to demand ischemia.  10. Congestive heart failure by acute on chronic diastolic dysfunction, ejection fraction is 55%.  11. Chronic atrial fibrillation.  12. Oral thrush.  13. Left shoulder pain, likely rotator cuff injury from his fall.  14. Type 2 diabetes mellitus.   DISCHARGE HOME MEDICATIONS:  1. Advair 250/50, 1 puff b.i.d.  2. Celexa 20 mg p.o. daily.  3. Plavix 75 mg p.o. daily.  4. Colace 100 mg p.o. b.i.d.  5. Glipizide 2.5 mg p.o. daily.  6. Lanoxin 125 mcg p.o. daily.  7. Simvastatin 20 mg p.o. daily.  8. Vitamin D3 2000 international units p.o. daily. 9. Colchicine 0.6 mg p.o. daily.  10. Metoprolol 25 mg p.o. b.i.d.  11. Diltiazem 60 mg p.o. q.6 hours.  12. Percocet 325/5 mg 1 tablet q.4 hours p.r.n. for pain.  13. Magnesium hydroxide oral suspension 30 mL p.r.n. for constipation.  14. Nystatin 100,000 units oral suspension 5 mL q.6 hours for 7 days.  15. Daptomycin 500 mg IV daily until 05/02/2012.  16. Protonix  40 mg p.o. daily.  17. Flomax 0.4 mg p.o. daily.   DISCHARGE HOME OXYGEN: 2 liters.   DISCHARGE DIET: Low sodium diet. Mechanical soft diet with thin liquids.    DISCHARGE ACTIVITY: As tolerated.   DISCHARGE INSTRUCTIONS:  1. Physical therapy.  2. IV daptomycin for four weeks starting 04/01/2012.  3. Orthopedics follow up in two weeks.  4. Podiatry follow up with Dr. Orland Jarred in two weeks for right heel ulcer.  5. Clinitron mattress to prevent sacral decubitus ulcers.  6. Primary care physician follow up in 1 to 2 weeks.  7. Follow up with infectious disease specialist, Dr. Leavy Cella, in three weeks.   LABORATORY, DIAGNOSTIC AND RADIOLOGICAL DATA: WBC 12.3, hemoglobin 9.3, hematocrit 28.0, platelet count 274. Urinalysis prior to discharge is negative for nitrates, leukocyte esterase, WBCs and bacteria. It has 2+ blood but only less than 1 RBC.    Sodium 138, potassium 4.2, chloride 101, bicarbonate 30, BUN 23, creatinine 1.09, glucose 177, calcium 8.3. CT of the left shoulder without contrast showing no acute bony abnormality, degenerative changes are present. Last chest x-ray showing chronic left basilar changes with some pleural effusion and also pulmonary interstitial edema. His last blood cultures from 04/04/2012 are negative. His prior blood cultures from 03/31/2012 and 03/29/2012 two sets and 03/26/2012 are positive for MRSA. 2-D echo showing mildly dilated left atrium, elevated right-sided pressures, mild valvular aortic stenosis, ejection fraction greater than 55%. No vegetation seen. Ultrasound kidneys bilaterally showing no evidence of any obstruction of both kidneys, simple appearing  cyst in left kidney. Creatinine on admission was 1.89, went up to 2.02 while in the hospital. Right hip x-ray showing no acute abnormality status post right hip replacement. TEE showing no evidence of any mass or vegetation. Mitral valve is grossly normal. Moderate to severe valvular aortic stenosis is  present. No pericardial effusion is present. CT of the chest without contrast showing bilateral pleural effusions larger on right with basilar atelectasis or infiltrate. Some upper lobe presumed atelectasis. Mild scattered pneumonitis versus edema is present bilaterally. Mild cardiomegaly with trace pericardial effusion is seen. Atherosclerotic disease is present.   BRIEF HOSPITAL COURSE: Mr. [name] [name] is a 81 year old elderly Caucasian male with prior history significant for hypertension, diabetes, peripheral vascular disease and chronic atrial fibrillation who was living at home by himself up until he had a recent fall and had right femoral hip fracture and was discharged to rehab. He recovered well at rehab and was discharged home and had a fall associated with fever and weakness and was brought over to the Emergency Room.  1. Persistent MRSA bacteremia. Patient was febrile with elevated white count on admission. His blood cultures were growing MRSA. He was started on IV vancomycin and was seen by Dr. Leavy CellaBlocker. There was no source identified for his MRSA. He did have pneumonia but his sputum cultures were never taken because he always had dry cough. Not sure if that is the cause or has to be presumed as endocarditis. He had an PTE and TEE done both of which did not show any vegetations. Two more sets of blood cultures after starting vancomycin have been positive for MRSA so his antibiotics have been changed over to daptomycin after which his blood cultures cleared up. So he will need to be on four weeks of daptomycin treatment starting from 04/04/2012; because of his persistent bacteremia the duration is four weeks instead of two weeks. He was seen by ortho about his recently fixed joint which did not appear infected.  2. Septic shock. Likely this is related to his MRSA bacteremia. He was in Intensive Care Unit requiring IV Levophed and has been gradually weaned off it and blood pressure is stable at this time.   3. Chronic atrial fibrillation. His heart rate has been stable around 80 to 90 range and because of his frequent falls and his age he is not a candidate for anticoagulation. He is on Plavix at this time, will continue that. He is also on Cardizem and also metoprolol for rate control.   4. Acute renal failure secondary to ATN on admission due to sepsis and also hypoxia. Renal function is improved. Just continue to monitor.  5. Left shoulder injury with pain with limitation of movement of the left shoulder. He had x-rays and CT done which were negative for any bony abnormality. Probably has rotator cuff injury from the fall, however, he could not tolerate MRA which was attempted twice. Ortho has seen the patient and just recommended a left shoulder sling and outpatient follow up. He also had been having some hand pain of his left hand. X-rays were negative, only showing degenerative changes. For now on pain medications p.r.n. If continues to have any worsening of his symptoms because of his history of gout will need to check uric acid level and also start him on a steroid taper. Patient is already on colchicine which is continued at the time of discharge.  6. Congestive heart failure, acute on chronic diastolic dysfunction, ejection fraction of 55%. While in Critical  Care Unit after receiving IV fluids he had an episode of acute on chronic congestive heart failure exacerbation so was diuresed with Lasix, now he is euvolemic and dehydrated, Lasix is being stopped at the time of discharge.  7. Right heel ulcer which is chronic, was seen by podiatrist and also wound care who just recommended dry dressing on it and he has also stage II sacral ulcer for which he is on Clinitron mattress and recommend physical therapy and avoid lying in bed for long periods of time.  8. Urinary retention and hematuria. Patient had an episode of urinary retention since admission for which Foley catheter was placed and he had hematuria  over the last couple of days which is probably muscle breakdown because his urinalysis shows 2+ blood but only less than 1 RBC. Foley catheter is taken out for a trial of voiding and flomax added- but not successful. bladder scan with 350cc urine. so foley reinserted and pt will have foley and will to be follow up with urology in 1-2 weeks. 9. Decreased oral intake since being started on antibiotics and also complaining of some throat pain with mild thrush so nystatin is being started and he likes ice cream so will send ice cream twice a day with meals. 10. His course has been otherwise uneventful in the hospital.   DISCHARGE CONDITION: Stable.   DISCHARGE DISPOSITION: To short-term rehab as recommended by physical therapy. Patient will be going to [facility] rehab.   CODE STATUS: DO NOT RESUSCITATE per patient's wishes.   TIME SPENT ON DISCHARGE: 45 minutes.       Patient's daughter, Ms. Gershon Cull, was called and updated on the plan and is agreeable to it.   ____________________________ Enid Baas, MD rk:cms D: 04/07/2012 14:35:07 ET T: 04/07/2012 15:13:42 ET JOB#: [id number]  cc: Enid Baas, MD, <Dictator> Lyndon Code, MD  Enid Baas MD ELECTRONICALLY SIGNED 04/07/2012 17:19

## 2014-12-27 NOTE — Consult Note (Signed)
Impression: 81yo WM w/ h/o DM admitted with Methacillin Resistant Staph aureus bacteremia.  No clear source for his bacteremia.  He has a mild cough that is not productive, but his CXR does not show an obvious infiltrate.  Agree with vanco. TTE did not note any vegetations, but there was mitral regurgitation and the tricuspid valve was not well visualized.  Will get TEE. Repeat BCx were drawn yesterday and are pending. If the BCx remain negative, will arrange for PICC line placement.  He will need 2-4 weeks of therapy depending on the results of the TEE. His left shoulder is tender, but does not appear to be warm or fluctuant.    Electronic Signatures: Emilyn Ruble, Rosalyn GessMichael E (MD)  (Signed on 22-Jul-13 12:42)  Authored  Last Updated: 22-Jul-13 12:42 by Saniyya Gau, Rosalyn GessMichael E (MD)

## 2015-01-01 NOTE — Consult Note (Signed)
PATIENT NAME:  [name] [name], [name] [name] MR#:  [id number] DATE OF BIRTH:  12-Mar-1917  DATE OF CONSULTATION:  02/11/2012  REFERRING PHYSICIAN:  Juanell FairlyKevin Krasinski, MD  CONSULTING PHYSICIAN:  Anniebell Bedore R. Sherrlyn HockPandit, MD  REASON FOR CONSULTATION: Abnormal PTT and PT/INR, needs surgery for hip fracture, evaluate for coagulopathy.   HISTORY OF PRESENT ILLNESS: The patient is a 81 year old gentleman with past medical history significant for chronic atrial fibrillation, diabetes mellitus, and hyperlipidemia who has been admitted to the hospital on June 3rd after he had a fall and sustained right hip pain and was found to have a right femoral neck fracture. The patient is being planned for surgery by Dr. Martha ClanKrasinski as soon as possible. Screening labs preoperatively have shown that PTT was 43.0 on June 3rd, INR was 1.1 with creatinine of 1.04, hemoglobin 11.7, and platelets of 150,000. The patient had repeat labs done today which showed PTT once again elevated at 46.4 and INR was now also elevated at 1.4. Clinically the patient denies any bleeding diathesis in the past. He denies any known history of chronic liver disease. He denies any history of alcohol intake on a regular basis. Currently no obvious bleeding symptoms. The patient also denies any known history of thromboembolic phenomena like DVT, PE, strokes (only history is remote mild MI in the past).   PAST MEDICAL HISTORY/PAST SURGICAL HISTORY:  1. Diabetes mellitus.  2. Hypertension.  3. History of remote mild MI in the past.  4. Degenerative joint disease status post right knee replacement in 2007.  5. Chronic bilateral lower extremity venous insufficiency. 6. History of peripheral vascular disease.  7. History of chronic atrial fibrillation/flutter.  8. Hyperlipidemia. 9. Back surgery in 2001.  10. Cataract surgery. 11. Right knee replacement.   FAMILY HISTORY: Noncontributory, remarkable for diabetes and hypertension. Denies bleeding disorders.   SOCIAL  HISTORY: The patient denies smoking or alcohol intake. Lives by himself. Physically active.   HOME MEDICATIONS:  1. Tramadol 50 mg daily as needed.  2. Plavix 75 mg daily.  3. Citalopram 20 mg daily.  4. Xanax 0.25 mg p.o. daily.  5. Digoxin 0.125 mg p.o. daily.   ALLERGIES: No known drug allergies.   REVIEW OF SYSTEMS: CONSTITUTIONAL: Denies any progressive fatigue. Has been physically active up until this recent fall and hospitalization. No fevers, chills, or night sweats. HEENT: Denies any recent headaches, dizziness, epistaxis, ear or jaw pain. No new sinus symptoms. CARDIAC: Denies any angina, palpitation, orthopnea, or PND. Has chronic leg swelling and peripheral vascular disease. LUNGS: No new dyspnea at rest, cough, sputum, or hemoptysis. GI: Denies nausea, vomiting, diarrhea, abdominal pain. No blood in stools. GU: No dysuria or hematuria. SKIN: No new rashes or pruritus. HEMATOLOGIC: No obvious bleeding symptoms. MUSCULOSKELETAL: Has chronic arthritis which is unchanged. No new pains. NEUROLOGIC: Denies any recent focal weakness, seizures, or loss of consciousness. ENDOCRINE: No polyuria or polydipsia. Appetite and weight are steady.   PHYSICAL EXAMINATION:   GENERAL: The patient is an elderly gentleman, moderately built and well nourished individual, resting in bed and is alert and oriented, converses appropriately, no acute distress.   VITAL SIGNS: Temperature 97.8, heart rate 95, respiratory rate 18, blood pressure 80/53, 95% on room air.   HEENT: Normocephalic, atraumatic. Extraocular movements intact. Sclerae anicteric.   NECK: Negative for lymphadenopathy.  CARDIOVASCULAR: S1, S2, irregular rate and rhythm.   LUNGS: Lungs show bilateral good air entry, no rhonchi.   ABDOMEN: Soft, nontender. No hepatosplenomegaly clinically.   LYMPHATICS: No adenopathy in the  axillary or inguinal areas.   EXTREMITIES: Mild swelling, chronic per patient.   NEUROLOGIC: Limited  examination given his condition. Cranial nerves are intact, moves all extremities spontaneously.   LABORATORY, DIAGNOSTIC, AND RADIOLOGICAL DATA: Liver function tests today shows bilirubin 1.3. Unremarkable liver enzymes. Albumin low at 3.0. PTT 46.4. INR 1.4 with PT of 17.7. WBC 9.4, hemoglobin 10.3, platelets 122, ANC 8200.   IMPRESSION AND RECOMMENDATIONS: This is a 81 year old gentleman with history of diabetes, hypertension, and hyperlipidemia who has been physically active admitted status post fall and right femoral neck fracture being planned for surgery as soon as possible. The patient has been found to have coagulopathy with mildly prolonged APTT of 43 to 46 seconds. PT/INR prolonged at 1.4. The patient does not have any obvious bleeding symptoms or known history of bleeding diathesis in the past. He also does not have any strong history of thromboembolic phenomena. Only history is remote mild MI.  Possible etiologies for coagulopathy could be underlying chronic liver disease given low albumin of 3.0, prolonged PT and PTT values. He does need work-up to rule out other possible etiologies also. As discussed with Dr. Martha Clan earlier today, work-up has already been sent including APTT mixing study, PT mixing study, activity levels for factor VIII, IX, and XI, and lupus anticoagulant assay. We need to wait for results of this work-up until it is at least clear as to etiology of coagulopathy. In the meantime, I agree with giving Vitamin K to see if it will correct his coagulopathy since surgery needs to be done as soon as possible. Will follow-up on results of work-up when it is available and make further recommendation. Will also repeat PT/INR and PTT tomorrow. The patient and family present were explained above, they are agreeable to this plan.   Thank you for the referral. Please feel free to contact me if any additional questions.   ____________________________ Maren Reamer Sherrlyn Hock,  MD srp:drc D: 02/12/2012 06:42:19 ET T: 02/12/2012 07:38:57 ET JOB#: [id number] cc: Darryll Capers R. Sherrlyn Hock, MD, <Dictator> Wille Celeste MD ELECTRONICALLY SIGNED 02/12/2012 8:05

## 2015-01-01 NOTE — H&P (Signed)
Subjective/Chief Complaint Right hip pain    History of Present Illness Patient is a 81 y/o male who fell while at a doctor's office visit today.  He tripped over the threshold of the exam room and fell onto the right side.  He had pain and an inability to bear weight after the fall.  EMS brought the patient to the [facility] ER where radiographs were taken and he was diagnosed with a right  femoral neck hip fracture.  Patient is seen this evening in his room with family at the bedside.  He denies LOC or other injuries.  He is currently comfortable.  His right leg is in Buck's traction.   Past Med/Surgical Hx:  osteoporosis:   DM:   arthritis:   tonsillectomy: 81 years old  left knee arthroscopy:   back surgery:   appendectomy:   right total knee repacement:   ALLERGIES:  No Known Allergies:   HOME MEDICATIONS: Medication Instructions Status  digoxin 125 mcg (0.125 mg) oral tablet 1 tab(s) orally once a day 1hr before breakfast Active  Plavix 75 mg oral tablet 1 tab(s) orally once a day (in the morning) Active  alprazolam 0.25 mg oral tablet 1 tab(s) orally once a day (at bedtime) Active  citalopram 20 mg oral tablet 1 tab(s) orally once a day (in the morning) Active  tramadol 50 mg oral tablet 1 tab(s) orally , As Needed Active   Review of Systems:   Subjective/Chief Complaint Right hip pain.    Fever/Chills No    Cough No    Abdominal Pain No    Nausea/Vomiting No    SOB/DOE No    Chest Pain No    Medications/Allergies Reviewed Medications/Allergies reviewed   Physical Exam:   GEN no acute distress    HEENT PERRL, hearing intact to voice, moist oral mucosa, Oropharynx clear    NECK supple  No masses  trachea midline    RESP normal resp effort  clear BS  no use of accessory muscles    CARD irregular rate  no murmur  No LE edema  no JVD    ABD denies tenderness  soft  normal BS  no Adominal Mass    GU foley catheter in place  clear yellow urine draining     LYMPH negative neck    EXTR No erythema or ecchymosis over right hip.  Hip slightly externally rotated.  Buck's traction in place.  Pedal pulses are intact.  He has intact sensation to light touch and can flex and extend his toes.    SKIN normal to palpation    NEURO motor/sensory function intact    PSYCH alert   Lab Results: Routine BB:  03-Jun-13 11:18    Crossmatch Unit 1 Ready   Crossmatch Unit 2 Ready (Result(s) reported on 10 Feb 2012 at 03:26PM.)   ABO Group + Rh Type AB Positive   Antibody Screen NEGATIVE (Result(s) reported on 10 Feb 2012 at 12:07PM.)  Cardiology:  03-Jun-13 13:59    Echo Doppler  Interpretation Summary   The left atrium is mildly dilated. Left ventricular systolic function  is normal. Ejection Fraction = >55%. The mitral valve leaflets  appear thickened, but open well. There is mild tricuspid  regurgitation. Mild aorticregurgitation. Mild valvular aortic  stenosis. There is moderate mitral regurgitation. Right ventricular  systolic pressure is elevated at 30-55mHg.  Procedure:   A two-dimensional transthoracic echocardiogram with color flow and  Doppler was performed.   The study was  technically difficult with many images being suboptimal  in quality.   Portable echo in ER.  Pt. supine cant be positioned due to right hip  injury.  Left Ventricle   Left ventricular systolic function is normal.   Ejection Fraction = >55%.  Right Ventricle   The right ventricle is grossly normal size.  Atria   The left atrium is mildly dilated.  Mitral Valve   The mitral valve leaflets appear thickened, but open well.   There is moderate mitral regurgitation.  Tricuspid Valve   The tricuspid valve is not well visualized.   There is mild tricuspid regurgitation.   Right ventricular systolic pressure is elevated at 30-63mHg.  Aortic Valve   Mild aortic regurgitation.   Mild valvular aortic stenosis.  MMode 2D Measurements and Calculations   RVDd:  3.0 cm   IVSd: 1.4 cm   LVIDd: 4.3 cm   LVIDs: 2.7 cm   LVPWd: 1.5 cm   FS: 37 %   EF(Teich): 67 %   Ao root diam: 3.7 cm   LA dimension: 4.5 cm   LVOT diam: 2.2 cm  Doppler Measurements and Calculations   MV E point: 165 cm/sec   MV dec time: 0.21 sec   Ao V2 max: 256 cm/sec   Ao max PG: 26 mmHg   Ao V2 mean: 179 cm/sec   Ao mean PG: 15 mmHg   Ao V2 VTI: 51 cm   AVA(I,D): 1.5 cm2   AVA(V,D): 1.1 cm2   LV max PG: 2.063mg   LV mean PG: 2.0 mmHg   LV V1 max: 77 cm/sec   LV V1 mean: 60 cm/sec   LV V1 VTI: 20 cm   SV(LVOT): 77 ml   PA V2 max: 73 cm/sec   PA max PG: 2.0 mmHg   TR Max vel: 279 cm/sec   TR Max PG: 31 mmHg   RVSP: 36 mmHg   RAP systole: 5.0 mmHg  Reading Physician: FaBartholome BillSonographer: HeSherrie Sportnterpreting Physician:  KeBartholome Bill electronically signed on  02-10-2012 16:35:36 Requesting Physician: FaBartholome BillRoutine Chem:  03-Jun-13 11:18    Glucose, Serum  138   BUN  22   Creatinine (comp) 1.04   Sodium, Serum 141   Potassium, Serum 4.2   Chloride, Serum  108   CO2, Serum 24   Calcium (Total), Serum 8.9   Anion Gap 9   Osmolality (calc) 287   eGFR (African American) >60   eGFR (Non-African American) >60 (eGFR values <6056min/1.73 m2 may be an indication of chronic kidney disease (CKD). Calculated eGFR is useful in patients with stable renal function. The eGFR calculation will not be reliable in acutely ill patients when serum creatinine is changing rapidly. It is not useful in  patients on dialysis. The eGFR calculation may not be applicable to patients at the low and high extremes of body sizes, pregnant women, and vegetarians.)  Routine UA:  03-Jun-13 14:32    Color (UA) Yellow   Clarity (UA) Clear   Glucose (UA) Negative   Bilirubin (UA) Negative   Ketones (UA) Trace   Specific Gravity (UA) 1.015   Blood (UA) Negative   pH (UA) 6.0   Protein (UA) Negative   Nitrite (UA) Negative   Leukocyte Esterase (UA) Negative  (Result(s) reported on 10 Feb 2012 at 02:59PM.)   RBC (UA) 1 /HPF   WBC (UA) <1 /HPF   Bacteria (UA) NONE SEEN   Epithelial Cells (UA) NONE SEEN  Mucous (UA) PRESENT (Result(s) reported on 10 Feb 2012 at 02:59PM.)  Routine Coag:  03-Jun-13 11:10    Activated PTT (APTT)  43.0 (A HCT value >55% may artifactually increase the APTT. In one study, the increase was an average of 19%. Reference: "Effect on Routine and Special Coagulation Testing Values of Citrate Anticoagulant Adjustment in Patients with High HCT Values." American Journal of Clinical Pathology 2006;126:400-405.)    11:18    Prothrombin 14.7   INR 1.1 (INR reference interval applies to patients on anticoagulant therapy. A single INR therapeutic range for coumarins is not optimal for all indications; however, the suggested range for most indications is 2.0 - 3.0. Exceptions to the INR Reference Range may include: Prosthetic heart valves, acute myocardial infarction, prevention of myocardial infarction, and combinations of aspirin and anticoagulant. The need for a higher or lower target INR must be assessed individually. Reference: The Pharmacology and Management of the Vitamin K  antagonists: the seventh ACCP Conference on Antithrombotic and Thrombolytic Therapy. ZPHXT.0569 Sept:126 (3suppl): [id number]. A HCT value >55% may artifactually increase the PT.  In one study,  the increase was an average of 25%. Reference:  "Effect on Routine and Special Coagulation Testing Values of Citrate Anticoagulant Adjustment in Patients with High HCT Values." American Journal of Clinical Pathology 2006;126:400-405.)  Routine Hem:  03-Jun-13 11:18    WBC (CBC) 7.0   RBC (CBC)  3.42   Hemoglobin (CBC)  11.7   Hematocrit (CBC)  35.8   Platelet Count (CBC) 150   MCV  105   MCH  34.4   MCHC 32.8   RDW  15.0   Neutrophil % 82.7   Lymphocyte % 10.5   Monocyte % 4.7   Eosinophil % 1.5   Basophil % 0.6   Neutrophil # 5.8   Lymphocyte  #  0.7   Monocyte # 0.3   Eosinophil # 0.1   Basophil # 0.0 (Result(s) reported on 10 Feb 2012 at 12:08PM.)     Assessment/Admission Diagnosis Right femoral neck hip fracture    Plan I have seen and examined the patient.  I explained to the patient and his family the diagnosis of a femoral neck hip fracture adn drew a diagram on the patient's white board in the room of the injury and the surgery to be performed.   I have recommended a hemiarthoplasty.   I explained in detail the injury, the surgery and the hospital course.  The risks include, but are not limited to: infection, bleeding requiring transfusion, nerve and blood vessel injury (especially the sciatic nerve leading to foot drop), dislocation, fracture, leg length discrepancy, change in lower leg rotation, persistent pain, need for more surgery including conversion to a total hip arthroplasty, DVT, and PE, MI, pneumonia, stroke, respiratory failure and death.  The risks and benefits of surgical intervention were discussed in detail with the patient and his family. The patient expressed understanding of the risks and benefits and agreed with plans for surgery. I answered all their questions.  The patient has been seen and cleared by [facility] for surgery.  The Surgical site signed as per "right site surgery" protocol. The patient will be NPO after midnight.   Electronic Signatures: Thornton Park (MD)  (Signed 03-Jun-13 19:17)  Authored: CHIEF COMPLAINT and HISTORY, PAST MEDICAL/SURGIAL HISTORY, ALLERGIES, HOME MEDICATIONS, REVIEW OF SYSTEMS, PHYSICAL EXAM, LABS, ASSESSMENT AND PLAN   Last Updated: 03-Jun-13 19:17 by Thornton Park (MD)

## 2015-01-01 NOTE — Consult Note (Signed)
General Aspect 81 yo male with history of chronic afib for at least the past year who is post op day #4 from orthopedic surgery. Note to have afib with rvr and diapharesis and was transferred to telemetry. Pt is a very difficult historian. He denies any knowledge of cardiac problems. He denies chest pain or shortness of breath. Telemetry reveals afib with ventricular range between 90 and 120. He is relatively hypotensive. He has not been treated for hyper tension as an outpatient per daughter. Daughter states he has been diapharetic at night for some time. He is currently hemodynamically stable.   Physical Exam:   GEN well nourished, thin    HEENT hearing intact to voice    RESP no use of accessory muscles  rhonchi    CARD Irregular rate and rhythm  Tachycardic  Murmur    Murmur Systolic    Systolic Murmur axilla    ABD denies tenderness  normal BS  no Adominal Mass    LYMPH negative neck    EXTR negative cyanosis/clubbing, negative edema    SKIN normal to palpation    NEURO cranial nerves intact, motor/sensory function intact    PSYCH poor insight   Review of Systems:   General: Fatigue    Skin: No Complaints    ENT: No Complaints    Eyes: No Complaints    Neck: No Complaints    Respiratory: No Complaints    Cardiovascular: Palpitations    Gastrointestinal: No Complaints    Genitourinary: No Complaints    Vascular: No Complaints    Musculoskeletal: No Complaints    Neurologic: No Complaints    Hematologic: No Complaints    Endocrine: No Complaints    Psychiatric: No Complaints    Review of Systems: All other systems were reviewed and found to be negative    Medications/Allergies Reviewed Medications/Allergies reviewed     osteoporosis:    DM:    arthritis:    tonsillectomy: 81 years old   left knee arthroscopy:    back surgery: 2002   appendectomy: 1934   right total knee repacement: 15-Apr-2006  Home Medications: Medication  Instructions Status  digoxin 125 mcg (0.125 mg) oral tablet 1 tab(s) orally once a day 1hr before breakfast Active  Plavix 75 mg oral tablet 1 tab(s) orally once a day (in the morning) Active  alprazolam 0.25 mg oral tablet 1 tab(s) orally once a day (at bedtime) Active  citalopram 20 mg oral tablet 1 tab(s) orally once a day (in the morning) Active  tramadol 50 mg oral tablet 1 tab(s) orally , As Needed Active   EKG:   Interpretation afib with variable ventricular response.    No Known Allergies:     Impression 81 yo male with history of chronic afib treated with digoxin for rate control and plavix for antiplatelet therapy who was transferred to telemetry from orthopedics where he is pod 4 from surgery. Noted to have increased heart rate this am and relative hypotension. He is currently in afib with variable ventricular response. Very difficult historian but denies chest pain. Blood pressure is relatively low limiting rate controlling agents. Would countinue with digoxin at current dose and follow heart rate and pressure. Consider low dose calckum channel blockers as pressure tolerates.    Plan 1. Continue with digoxin at 0.125 mg daily 2. Follow heart rate and blood pressure. 3. Continue rehab form hip surgery. 4. Will follow with you.   Electronic Signatures: Dalia Heading (MD)  (  Signed 11-Jun-13 16:53)  Authored: General Aspect/Present Illness, History and Physical Exam, Review of System, Past Medical History, Home Medications, EKG , Allergies, Impression/Plan   Last Updated: 11-Jun-13 16:53 by Dalia HeadingFath, Earnestine Tuohey A (MD)

## 2015-01-01 NOTE — Consult Note (Signed)
PATIENT NAME:  [name] [name], Jaishon MR#:  [id number] DATE OF BIRTH:  07/25/1917  DATE OF CONSULTATION:  02/10/2012  REFERRING PHYSICIAN:  Juanell FairlyKevin Krasinski, MD  CONSULTING PHYSICIAN:  Jessalynn Mccowan A. Allena KatzPatel, MD   PRIMARY CARE PHYSICIAN: Beverely RisenFozia Khan, MD   REASON FOR CONSULTATION: Preop medical clearance. The patient has right femoral neck fracture status post fall.   HISTORY OF PRESENT ILLNESS: Mr. [name] [name] is a 81 year old very pleasant Caucasian gentleman with past medical history of chronic atrial fibrillation, type II diabetes, and hyperlipidemia who comes to the Emergency Room after he sustained a fall during his visit at the dermatologist's office this morning. The patient complained of right hip pain after the fall, was unable to bear weight, and was brought to the Emergency Room and found to have right femoral neck fracture. Dr. Martha ClanKrasinski requests Internal Medicine consult for preop medical clearance.   The patient reports he's been having pain in the right knee on and off for a few weeks and some bilateral lower extremity swelling, more on the right leg, along with some rash on his right lower extremity. He was at the dermatologist's office to get it checked and felt his right foot get caught and lost balance and sustained a fall resulting in a right femoral neck fracture. He denies any syncopal episode. No witnessed seizures or any other head trauma. The patient is being admitted on the orthopedic floor.   The patient denies any chest pain, shortness of breath. He is otherwise functional with his routine chores, getting around well. The only limiting factor is his arthritis which slows him down a bit. The patient denies any history of coronary artery disease, although son mentions the patient had a mild MI in the past.   PAST MEDICAL HISTORY:  1. Degenerative joint disease status post right knee replacement in 2007.  2. Type II diabetes. The patient occasionally takes p.o. glipizide if his sugars are  higher.  3. Hypertension.  4. Chronic bilateral lower extremity venous insufficiency with history of peripheral vascular disease.  5. History of remote mild MI in the past.  6. History of chronic atrial fibrillation/flutter.  7. Hyperlipidemia.   PAST SURGICAL HISTORY:  1. Back surgery in 2001.  2. Cataract surgery in 2005 and in 2007. 3. Right knee replacement.   FAMILY HISTORY: Diabetes and hypertension.   SOCIAL HISTORY: He is widowed. Denies smoking. Denies alcohol use. He lives by himself. His son checks on him. He still continues to play golf three times a week.   CURRENT MEDICATIONS:  1. Tramadol 50 mg daily as needed.  2. Plavix 75 mg daily.  3. Digoxin 0.125 mg p.o. daily.  4. Citalopram 20 mg daily.  5. Alprazolam 0.25 mg p.o. daily.   ALLERGIES: No known drug allergies.   REVIEW OF SYSTEMS: CONSTITUTIONAL: No fever, fatigue, weakness. EYES: No blurred or double vision. ENT: No tinnitus, ear pain, hearing loss. RESPIRATORY: No cough, wheeze, hemoptysis. CARDIOVASCULAR: Positive for hypertension and leg edema. No chest pain, orthopnea, edema, or history of coronary artery disease. GI: No nausea, vomiting, diarrhea, or abdominal pain. GU: No dysuria or hematuria. ENDOCRINE: No polyuria or nocturia. HEMATOLOGY: No anemia or easy bruising. SKIN: No acne or rash. SKIN: The patient has bilateral lower extremity venostasis changes. Mild pruritic rash on the right lower extremity. MUSCULOSKELETAL: Positive for arthritis. NEUROLOGIC: No CVA or TIA. PSYCH: No anxiety or depression. All other systems reviewed and negative.   LABORATORY, DIAGNOSTIC, AND RADIOLOGICAL DATA: EKG shows chronic atrial fibrillation, heart  rate in the 70's.   White count 7.0, hemoglobin and hematocrit 11.7 and 35.8, platelet count 150, MCV 105, glucose 138, BUN 22, creatinine 1.04, sodium 141, potassium 4.2, chloride 108, bicarb 24, calcium 8.9. PT-INR 14.7 and 1.1.   AP pelvis fracture of the right femoral  neck.   Right foot x-ray no acute changes identified.   Chest x-ray no pneumonia noted. There is mild pulmonary vascularity.   ASSESSMENT AND PLAN: 81 year old [name] with:  1. Chronic atrial fibrillation. The patient's heart rate is in acceptable ranges around 70's. Will continue digoxin.  2. Bilateral lower extremity venostasis more on the right than the left. The patient's ultrasound Doppler of the right lower extremity is negative for DVT. He does not seem to be volume overloaded. Will hold off on any diuretics at this time. The patient does have changes of chronic venostasis in both extremities.  3. Right knee pain with degenerative joint disease. Continue p.o. Ultram.  4. Type II diabetes. The patient tries to manage with diet control. He occasionally takes p.r.n. glipizide if his sugars go above 130. Will continue sliding scale insulin. If sugars remain high, will consider starting the patient on regular scheduled p.o. Glipizide.  5. History of mild MI in the remote past. Will get echo Doppler of the heart.  6. I will hold off on Plavix given anticipated surgery tomorrow.  7. The patient is at low to intermediate risk for noncardiac surgery at this time. Risks and complications of surgery were discussed with the patient and the patient's son.   All of the above was discussed with Dr. Martha Clan who was agreeable to it.   TIME SPENT: 45 minutes. Thank you for the consult. Will follow while the patient is in-house.   ____________________________ Wylie Hail. Allena Katz, MD sap:drc D: 02/10/2012 14:37:40 ET T: 02/10/2012 14:58:08 ET JOB#: [id number]  cc: Adelee Hannula A. Allena Katz, MD, <Dictator> Kathreen Devoid, MD Willow Ora MD ELECTRONICALLY SIGNED 02/15/2012 14:13

## 2015-01-01 NOTE — Discharge Summary (Signed)
PATIENT NAME:  [name] [name], [name] [name] DATE OF BIRTH:  1917/06/02  DATE OF ADMISSION:  02/10/2012 DATE OF DISCHARGE:  02/22/2012   ADMITTING DIAGNOSIS: Right femoral neck hip fracture.   HISTORY OF PRESENT ILLNESS: [name] is a 81 year old male who tripped and fell at a doctor's office visit on the day of admission. He tripped over the threshold of the exam room and fell on his right side. He was diagnosed with a femoral neck hip fracture upon presentation to the ER. He was diagnosed by x-ray.   PAST MEDICAL HISTORY:  1. Osteoporosis. 2. Diabetes mellitus. 3. Arthritis.  4. Tonsillectomy.  5. Left knee arthroscopy. 6. Previous spinal surgery. 7. Appendectomy. 8. Status post right total knee replacement.   HOME MEDICATIONS:  1. Digoxin 125 mcg p.o. daily. 2. Plavix 75 mg daily. 3. Alprazolam 0.25 mg daily. 4. Citalopram 20 mg p.o. q.a.m.  5. Tramadol 50 mg daily.   ALLERGIES: He has no known drug allergies.   HOSPITAL COURSE: The patient was admitted to the orthopedic surgery service. He was found to have an elevated PTT. Hematology was consulted. The patient had a full hematology work-up. He was treated preoperatively with Vitamin K for an elevation of his INR after presentation. The patient was seen and cleared for surgery by the [facility] medical service. Hospitalist service and Dr. Sherrlyn HockPandit agreed that it was safe to proceed with surgery and so on 02/13/2012 the patient underwent an uncomplicated right hip hemiarthroplasty. The patient did have an EBL of 1150. He received 2 units of intraoperative PRBCs to accommodate this blood loss. Postoperatively the patient returned to the orthopedic floor. He continued to be followed by orthopedics and the hospitalist service. He had 24 hours of postop antibiotics. He was placed on Lovenox 30 sub-Q b.i.d. for DVT prophylaxis. He had an ultrasound of the right lower extremity which was negative for DVT. The patient was found to be lupus  anticoagulant positive which explained his elevated PTT on admission. The patient had his Foley catheter removed on postop day #2. His dressing was changed. By postop day # 4 the patient had developed a cough and stated he did not feel well. By postop day #5 he had been moved to telemetry floor for tachycardia and episodes of hypotension. Cardiology was consulted. Minor adjustments were made in the patient's cardiac medications. The patient continued to make slow clinical progress. Orthopedically he continued to improve. His right hip pain diminished. He was making slow but steady progress with physical therapy. Physical therapy had been following the patient since his operation as did occupational therapy. On 02/21/2012 the patient was being prepared for discharge but had increasing productive cough and did not feel well. Chest x-ray was repeated which showed atelectasis versus possible mild pneumonia. The patient had already been started on Levaquin 250 mg daily and his dose was increased to 500 mg daily for possible pneumonia. The patient remained afebrile and hemodynamically stable. His hematocrit remained stable as well. The patient given his clinical stability was prepared for discharge on 02/22/2012. Dr. Allena KatzPatel thought medically he was stable for discharge. Orthopedically he was doing well and making progress with physical therapy.   DISCHARGE INSTRUCTIONS:  1. The patient will be discharged with the need for continued physical therapy for gait training and lower extremity strengthening.  2. He should continue to observe posterior precautions.  3. He should have dry sterile dressing changes until the wound is completely dry.  4. He will remain on  Lovenox 40 mg daily until he is discharged from rehab. At that point he may be sent home on 325 mg of enteric-coated aspirin b.i.d.  5. The patient should continue the use of his TED stockings.  6. He will follow-up in my office in 7 to 10 days for wound check  and staple removal.  7. He should continue taking Levaquin 500 mg daily for at least one week.  8. The patient will need to follow-up with his primary care physician to check on his heart rate, blood pressure, and to monitor his recovery from possible pneumonia.   DISCHARGE EXAMINATION: The patient's dressing was clean, dry, and intact. The ecchymosis and swelling over the right hip was stable. His thigh compartments were soft and compressible. He had intact motor and sensory function in the bilateral lower extremities with palpable pedal pulses and no calf tenderness. He had 5 out of 5 strength in all muscle groups of the right lower extremity and intact sensation to light touch in both lower extremities as well.   DISCHARGE DIAGNOSES:  1. Status post right hip hemiarthroplasty for femoral neck hip fracture.  2. Diagnosis of lupus anticoagulant positive coagulopathy.  3. Postoperative hypotension. 4. Postoperative tachycardia.  5. Pneumonia. 6. Acute postoperative anemia in the setting of lupus anticoagulant coagulopathy.   ____________________________ Kathreen Devoid, MD klk:drc D: 02/22/2012 13:17:34 ET T: 02/22/2012 13:32:17 ET JOB#: [id number]  cc: Kathreen Devoid, MD, <Dictator> Kathreen Devoid MD ELECTRONICALLY SIGNED 02/24/2012 14:01

## 2015-01-01 NOTE — Consult Note (Signed)
Brief Consult Note: Diagnosis: Chronic Atrial fibrillation, bilateral LE venous stasis with right leg >left leg, right knee pain with DJD, Dm-2.   Patient was seen by consultant.   Consult note dictated.   Recommend to proceed with surgery or procedure.   Orders entered.   Discussed with Attending MD.   Comments: 1. Right LE doppler is NEGATIVE for DVT 2.Cont po home meds except hold plavix and resume post-op 3.GET echo of the heart today 4. pt has chronic afib with no cp or sob. quiet functional otherwise some mobility limited due to knee DJD 5.h/o mild MI in the remote past (no cath) 6.HL on simvastatin 7.Dm-2 will cont SSI if needed start po glipizide  d/w family and Dr Real ConsKrazinski.  Electronic Signatures: Willow OraPatel, Deckard Stuber A (MD)  (Signed 03-Jun-13 14:27)  Authored: Brief Consult Note   Last Updated: 03-Jun-13 14:27 by Willow OraPatel, Livingston Denner A (MD)

## 2015-01-01 NOTE — Op Note (Signed)
PATIENT NAME:  [name] [name], [name] [name] MR#:  [id number] DATE OF BIRTH:  [birth year]  DATE OF PROCEDURE:  02/13/2012  PREOPERATIVE DIAGNOSIS: Right femoral neck hip fracture.   POSTOPERATIVE DIAGNOSIS: Right femoral neck hip fracture.   PROCEDURE: Right hip hemiarthroplasty.   SURGEON: Timoteo Gaul, MD   ESTIMATED BLOOD LOSS: 1150 mL.  FOLEY OUTPUT: 275 mL.  CRYSTALLOID: 2200 mL.  TRANSFUSION: 2 units of packed red blood cells.   SPECIMENS: Femoral head to pathology.   COMPLICATIONS: None.   IMPLANTS: Stryker Accolade HFX 127 degree neck angle hip stem size 6, Stryker Unitrax endoprosthesis head component 56 mm outer diameter, and a Stryker Unitrax neck adjustment sleeve +12 mm V40 taper.   INDICATIONS FOR PROCEDURE: The patient is a 81 year old male who fell while at a doctor's office for an appointment. He tripped over the threshold and landed on the right side injuring his right hip. He was brought to the [facility] Emergency Department where he was diagnosed with a femoral neck hip fracture by x-ray. He was admitted to the orthopedic surgery service. The patient was evaluated preoperatively by the hospitalist service. During the course of preop workup, the patient was found to have an elevated PTT. I spoke with the hematologist here at [facility], Dr. Ma Hillock. A coagulation work-up was performed including mixing studies and studies to determine factor deficiencies. Ultimately despite an elevated PTT the decision was made along with the hematologist to proceed with surgery. The patient had 4 units of packed red blood cells ordered prior to his operation. I explained the risks and benefits of surgery to the patient and his daughter and son-in-law. They explained they understood that he had a prolonged PTT and was at risk for increased bleeding. However, they also understood there were risks with not proceeding with surgery as well. Other risks included infection  requiring the removal of the components, bleeding requiring blood transfusions, nerve or blood vessel injury especially injury to the sciatic nerve leading to dorsal foot numbness or foot drop, fracture, dislocation, leg length discrepancy, change in lower extremity rotation, persistent right hip pain, and the need for further surgery including revision arthroplasty surgery. Medical complications would include myocardial infarction, stroke, pneumonia, respiratory failure, DVT and pulmonary embolism, and death. The patient understood these risks and signed the consent form for surgery and for blood transfusions prior to surgery.   PROCEDURE NOTE: The patient was met in the preoperative area. His daughter was by the bedside. I signed the right leg with the word yes according to the hospital's right site protocol. The patient was then brought to the operating room where he underwent general endotracheal intubation. He was then placed in a left lateral decubitus position with the right hip up. All bony prominences were adequately padded including an axillary roll under his left side and additional padding of the left, down leg to avoid compression to the common peroneal nerve during the case. The patient was then prepped and draped in a sterile fashion.   A time-out was performed to verify the patient's name, date of birth, medical record number, correct site of surgery, and correct procedure to be performed. It was also used to verify the patient had received antibiotics and that all appropriate instruments, implants, and radiographic studies were available in the room. Once all in attendance were in agreement, the case began.   A proposed incision was drawn out based upon bony landmarks. A #10 blade was used to make a curvilinear incision centered  over the greater trochanter. The subcutaneous tissues were then dissected using electrocautery. All bleeding vessels were cauterized using electrocautery during  exposure. The fascia lata was then identified and sharply incised using a deep #10 blade. This revealed the underlying hip bursa. This was resected and the gluteus maximus muscle was split in line with its fibers. This revealed the underlying external rotators. These were released from their posterior attachment to the greater trochanter and tagged for later repair. The external rotators were reflected posteriorly to protect the sciatic nerve. A T-shaped capsulotomy was then performed in both leaflets of the capsulotomy. The hip capsule was tagged for later repair. The fracture was then easily identified. It was removed using a corkscrew device. It was measured to be 56 mm in diameter. The acetabulum was copiously irrigated.   The attention was then turned to femoral neck preparation. An osteotomy was made approximately one fingerbreadth above the lesser trochanter. A box osteotome was then used to make initial entry point into the proximal femur. A single hand reamer and a canal finder were then sent down the canal of the femur to ensure adequate trajectory. Sequential broaches were then used. This started with a size 0 and moved up sequentially. The size 6 Accolade broach trial had the best mediolateral fit and had no evidence of loosening or rotational instability. Trial neck with a 56 head was then placed onto the trial femoral stem and reduced. The patient was found to be most stable with a +12 offset neck. All trial components were then removed. The hip joint including the femoral canal and acetabulum were copiously irrigated using pulse lavage. The bone surfaces were then suctioned clean. The actual #6 Stryker Accolade HFX stem was then inserted into the femoral canal. It seated in an excellent position. There was approximately 15 degrees of anteversion during placement of the stem in the femoral canal. A +12 sleeve with a 56 outer diameter Unitrax head were then impacted gently onto the femoral stem.  Calcar was checked for any fractures and was found to have none. The hip was then carefully reduced. It was again taken through a full range of motion and found to be extremely stable. The patient had no shuck and had excellent external rotation. The leg lengths were found to be equivalent as they had been during the trialing of the components. The hip joint was then copiously irrigated. The T-shaped capsulotomy was repaired side-to-side using a #2 Ti-Cron. The external rotators were repaired in a soft tissue repair to the abductor tendons. The hip was then copiously irrigated again with pulsatile lavage. The fascia lata was closed with interrupted 0 Vicryl suture. The subcutaneous tissues were then closed with 2-0 Vicryl and the skin approximated with staples. A dry sterile dressing was applied.   The patient was then rolled carefully onto his back. His leg lengths were found to be clinically equivalent. The patient was then extubated and transferred to a hospital bed after having an abduction pillow placed between his knees. I was scrubbed and present for the entire case and all sharp and instrument counts were correct at the conclusion of the case.   I spoke with the patient's family postoperatively in the patient's room on the orthopedic floor. I let them know that the patient was stable in recovery and that the case had gone without complication.   ____________________________ Timoteo Gaul, MD [id number] D: 02/14/2012 00:00:59 ET T: 02/14/2012 12:39:08 ET JOB#: [id number]  cc: Timoteo Gaul, MD, <Dictator>  Timoteo Gaul MD ELECTRONICALLY SIGNED 02/18/2012 16:52

## 2015-01-01 NOTE — Consult Note (Signed)
HEMATOLOGY followup note - no new complaints. Eating steadydenies bleeding Sxs. No fevers.A, O x 3, NAD          vitals - afebrile, stable          lungs - b/l good air entry          abd - soft, NT Labs: aPTT 47.5, INR 1.2. Factor 8,9,11 activity unremarkable. PTT mixing study does not correct completely. Lupus anticoagulant poasitive.    81 year old gentleman with history of diabetes, hypertension, and hyperlipidemia who has been physically active admitted status post fall and right femoral neck fracture being planned for surgery - INR is better after getting FFP trasnfusion and vit K yesterday. aPTT still prolonged but workup indicates presence oflupus anticoagulant, given this it does not increase the risk of bleeding but could increase risk of tromboembolic phenomena. Patient planned for hip surgery tomorrow, recommend pursuing adequate DVT prophylaxis post-operatively. No other intervention needed at this time. Will continue to follow on as needed basis.  The patient and family present were explained above.  Electronic Signatures: Izola PricePandit, Shamir Sedlar Raj (MD)  (Signed on 06-Jun-13 00:01)  Authored  Last Updated: 06-Jun-13 00:01 by Izola PricePandit, Jataya Wann Raj (MD)
